# Patient Record
Sex: Female | Born: 1960 | Race: White | Hispanic: No | Marital: Married | Smoking: Never smoker
Health system: Southern US, Community
[De-identification: ages and names within clinical notes are randomized; demographics above are authoritative.]

## PROBLEM LIST (undated history)

## (undated) DIAGNOSIS — R7989 Other specified abnormal findings of blood chemistry: Secondary | ICD-10-CM

## (undated) DIAGNOSIS — R945 Abnormal results of liver function studies: Secondary | ICD-10-CM

## (undated) DIAGNOSIS — E785 Hyperlipidemia, unspecified: Secondary | ICD-10-CM

## (undated) DIAGNOSIS — K219 Gastro-esophageal reflux disease without esophagitis: Secondary | ICD-10-CM

## (undated) HISTORY — PX: TUBAL LIGATION: SHX77

## (undated) HISTORY — PX: OTHER SURGICAL HISTORY: SHX169

## (undated) HISTORY — DX: Other specified abnormal findings of blood chemistry: R79.89

## (undated) HISTORY — PX: COLONOSCOPY: SHX174

## (undated) HISTORY — DX: Abnormal results of liver function studies: R94.5

## (undated) HISTORY — DX: Hyperlipidemia, unspecified: E78.5

## (undated) MED ORDER — BUPIVACAINE-EPINEPHRINE 0.25 %-1:200,000 IJ SOLN
0.25 %-1:200,000 | INTRAMUSCULAR | Status: AC
Start: ? — End: 2013-09-08

## (undated) MED ORDER — LIDOCAINE-EPINEPHRINE 1 %-1:100,000 IJ SOLN
1 %-:00,000 | Freq: Once | INTRAMUSCULAR | Status: AC
Start: ? — End: 2013-09-08

---

## 2011-02-23 NOTE — Telephone Encounter (Signed)
Called pt to remind her of appointment on 02/27/2011. Left message via voicemail indicating date and time of appointment. Instructed pt to call office if she has any concerns about her appointment time.

## 2011-02-27 MED ORDER — LIDOCAINE-EPINEPHRINE 1 %-1:100,000 IJ SOLN
1 %-:00,000 | Freq: Once | INTRAMUSCULAR | Status: AC
Start: 2011-02-27 — End: 2011-02-27

## 2011-02-27 NOTE — Progress Notes (Signed)
Patient present today for full skin exam.

## 2011-02-27 NOTE — Progress Notes (Signed)
Chief complaint: Check spots on the skin    History of present illness: [name] is a 50 year old lady here today for an initial visit.  She is a history of Neomia Dear that use and recreational some exposure.  She is a candidate present.  She is aware of a several raised on bumps on her skin.  One is located on her left cheek has been sensitive to touch.  She notices brown spots on her arms and legs.  She is feeling well today.  She does not have a personal history of skin cancer.  Her allergies medications medical and social history are reviewed by me today.    Exam: The Kimbrough is a well-appearing 50 year old lady in no distress.  She is tan throughout all examined areas.  There is no preauricular, submandibular, or cervical lymphadenopathy.  A full skin examination was performed including her scalp, face, ears, neck, chest, back, abdomen, upper and lower extremities, breasts, buttocks; genital skin was not examined.  There are  lentigos widespread.  She is scattered normal-appearing nevi on her face neck trunk and extremities.  Her right mid back shows a 12 by 8mm pink shiny pearly plaque.  Her left medial cheek shows a 4 x 3 mm thin keratotic papule.    Assessment/plan:  1.  Suspected basal cell carcinoma the right mid back.  I reviewed the diagnosis and relationship to tanning bed use and sun exposure.  Curettage was advised to address this lesion.  The procedure was reviewed and verbal and written consent were obtained.  The risks of pain, bleeding, infection, recurrence and scar were discussed.  I performed the procedure.  The site was cleansed and anesthetized with 1% lidocaine with epinephrine 1:100,000.  A shave biopsy was performed to sample the lesion followed by curettage of the base and 2-3 mm margin.  Drysol was used for hemostasis.  The wound was bandaged and care reviewed.  The specimen was sent to pathology.  I will contact the patient with the results.   2.  Actinic keratosis.  The diagnosis of this precancerous lesion related to sun exposure was reviewed.  I treated 1 lesion with cryotherapy and post-cryotherapy care was reviewed.   3.  Normal nevi, lentigos.  These diagnoses were reviewed.  4.  Personal history of skin cancer.  I discussed sun protection, sunscreen use, the warning signs of skin cancer, the need for self-skin examinations, and the need for regular physician exams every 6 months.  The patient should follow up sooner as needed if new, changing, or symptomatic skin lesions arise.

## 2011-02-27 NOTE — Patient Instructions (Signed)
WOUND CARE INSTRUCTIONS    1. Keep the dressing clean and dry and do not remove for 24/48 hours.    2. Then change the dressing once a day as follows:  a. Wash hands before and after each dressing change.  b. Remove dressing and wash site gently with mild soap and water, rinse, and pat dry.  c. Apply an ointment (Bacitracin, Polysporin, Neosporin, Petroleum jelly or Aquaphor).  d. Apply a non-stick (Telfa) dressing or Band-Aid to cover the wound.     For one week    3. Watch for:  BLEEDING: A small amount of drainage may occur. If bleeding occurs, elevate and rest the surgery site. Apply gauze and steady pressure for 15 minutes. If bleeding continues, call this.    INFECTION: Signs of infection include increased redness, pain, warmth, drainage of pus, and fever. If this occurs, call this office.    4. Special Instructions (follow any that are checked):  ?? []  You have stitches that need to be removed in 0 days  ?? []  Avoid bending at the waist and heavy lifting for two days.  ?? []  Sleep with your head elevated for the next two nights.  ?? []  Rest the surgery site and keep it elevated as much as possible for two days.  ?? []  You may apply an ice-pack for 10-15 minutes every waking hour for the rest of the day.  ?? []  Eat a soft diet and avoid hot food and hot drinks for the rest of the day.  ?? []  Other instructions:      Follow up in 6 months    If you have any questions or concerns, please call our office Monday through Friday at [phone].

## 2011-03-02 NOTE — Progress Notes (Signed)
Quick Note:    I called pt's cell on 7/17 and left message with result, BCC was treated with curettage, no further therapy needed at present, next skin exam advised in 6 months  ______

## 2012-01-02 NOTE — Telephone Encounter (Signed)
Pt called stating that needs her yearly skin exam she is due in July there are no skin exam opened until next year.

## 2012-04-05 NOTE — Progress Notes (Signed)
Chief complaint: History of basal cell carcinoma, nevus on the back, recently diagnosed basal cell carcinoma on the right shoulder    History of present illness: [name] is a 51 year old lady here for a followup visit. She was seen initially in July 2012 diagnosed and treated with curettage and superficial basal cell carcinoma on her right mid back. That site is well-healed without symptoms. In the interim she has seen a dermatologist in [place], and had a second superficial basal cell carcinoma diagnosed with biopsy on her right anterior shoulder. This has yet to be treated, and she requests evaluation and treatment today. She reports being dermatologist noted a mole with our color on her back, this is asymptomatic end of uncertain duration. She requests evaluation. She is feeling well and in her usual state of health today. She has no pain, no current illnesses, no other skin concerns. Her allergies medications medical and social history are reviewed by me today.    Exam: She is awake alert oriented 51 year old lady who appears well and in no distress. There is no preauricular, submandibular, or cervical lymphadenopathy. A full skin examination was performed including her scalp, face, ears, neck, chest, back, abdomen, upper and lower extremities, breasts, buttocks; genital skin was not examined. She is tanned in sun exposed areas, numerous lentigines are on all exposed areas. She has scattered cherry angiomas on her trunk, multiple normal-appearing nevi on her trunk and extremities. Her nevi are predominantly junctional, medium to dark brown in color. There is a dermatofibroma on her left anterior lower leg. Her left mid back shows a 5 x 3 mm junctional nevus with uniform color but irregular shape. Dermoscopy does not identify atypical features. Her right anterior shoulder has 8 by 7 mm pink plaque with scar she confirms this is the location of biopsy-proven superficial basal cell carcinoma. She has a  well-healed, minimal perceptible scar right mid back were superficial basal cell carcinoma was treated one year ago. There is no evidence of recurrence.    Assessment/plan:  1. History of basal cell carcinoma the right mid back. The site is well-healed with a subtle scar, no evidence of recurrence. I discussed sun protection, sunscreen use, the warning signs of skin cancer, the need for self-skin examinations, and the need for regular physician exams every 9-12 months.  The patient should follow up sooner as needed if new, changing, or symptomatic skin lesions arise.   2. Multiple benign nevi, lentigines, cherry angiomas, dermatofibroma. These benign diagnoses were discussed, the treatment is needed. Observation with regular skin exams.  3. Nevus, left mid back. This is darkly pigmented with irregular shape. A shave removal was advised to address this lesion.  The procedure was reviewed and verbal and written consent were obtained.  The risks of pain, bleeding, infection, recurrence and scar were discussed.  I performed the procedure.  The site was cleansed and anesthetized with 1% lidocaine with epinephrine 1:100,000.  A shave removal was performed to remove the lesion in its clinical entirety.  Drysol was used for hemostasis.  The wound was bandaged and care reviewed.  The specimen was sent to pathology.  I will contact the patient with the results and any further treatment that may be necessary.  4. Superficial basal cell carcinoma, right anterior shoulder.  A shave removal followed by curettage was advised to address this lesion with malignant destruction.  The procedure was reviewed and verbal and written consent were obtained.  The risks of pain, bleeding, infection, and scar and  recurrence were discussed.  I performed the procedure.  The site was cleansed and anesthetized with 1% lidocaine with epinephrine 1:100,000.  A shave removal was performed to remove the lesion in its clinical entirety followed by  curettage of the base and 2 mm margin, resulting in a post curettage defect size of 12 x 11 mm.  Drysol was used for hemostasis.  The wound was bandaged and care reviewed.  The specimen was sent to pathology.  I will contact the patient with the results and any further treatment that may be necessary.         [facility]  OFFICE PROCEDURE PROGRESS NOTE        Chart reviewed for the following:   I, Madilyn Fireman. Delorse Lek, MD, have reviewed the History, Physical and updated the Allergic reactions for Chloe Moses     TIME OUT performed immediately prior to start of procedure:   I, Madilyn Fireman. Delorse Lek, MD, have performed the following reviews on CHANTALLE DEFILIPPO prior to the start of the procedure:            * Patient was identified by name and date of birth   * Agreement on procedure being performed was verified  * Risks and Benefits explained to the patient  * Procedure site verified and marked as necessary  * Patient was positioned for comfort  * Consent was signed and verified     Time:       Date of procedure: 04/05/2012    Procedure performed by:  Madilyn Fireman. Delorse Lek, MD    Provider assisted by:  LPN    Patient assisted by: self    How tolerated by patient: tolerated the procedure well with no complications    Comments: none

## 2012-04-10 NOTE — Progress Notes (Signed)
Quick Note:    I called her cell # on 8/28 with report, normal nevus, no further treatment needed. I left a message.  ______

## 2012-09-09 NOTE — Progress Notes (Signed)
Pt present for skin exam for a scaly lesion on chest. First noticed one year ago.

## 2012-09-09 NOTE — Progress Notes (Signed)
Name: Chloe Moses       Age: 52 y.o.       Date: 09/09/2012    Chief Complaint: had a chief complaint of Skin Exam.    Subjective:    HPI:  Ms.. Chloe Moses is a 51 y.o. female who presents for the evaluation of a lesion on the central chest. The lesion appeared 1 years ago. The patient has not had any prior treatment.  Associated symptoms include scaling and persistent lesion.  She states the lesion has never bled. She has a personal history of Basal Cell Carcinoma on the right anterior shoulder for which she underwent curettage in August 2013.  She states this area has been trouble free. She has stopped using the tanning bed and is now getting spray tans.    ROS: Consitutional: Negative, Neuro: Negative for tingling/ numbness  Dermatological : positive for - skin lesion changes    History     Social History   ??? Marital Status: MARRIED     Spouse Name: N/A     Number of Children: N/A   ??? Years of Education: N/A     Occupational History   ??? Not on file.     Social History Main Topics   ??? Smoking status: Never Smoker    ??? Smokeless tobacco: Never Used   ??? Alcohol Use: Yes      Comment: socially   ??? Drug Use:    ??? Sexually Active:      Other Topics Concern   ??? Not on file     Social History Narrative   ??? No narrative on file       No family history on file.    Past Medical History   Diagnosis Date   ??? Tanning bed exposure        Past Surgical History   Procedure Laterality Date   ??? Hx gyn  1989     D+C       Current Outpatient Prescriptions   Medication Sig Dispense Refill   ??? ascorbic acid (VITAMIN C) 250 mg tablet Take  by mouth.           No current facility-administered medications for this visit.       Allergies   Allergen Reactions   ??? Pcn (Penicillins) Hives         Objective:    BP 104/68   Pulse 54   Temp(Src) 97.6 ??F (36.4 ??C) (Oral)   Resp 18   SpO2 99%    Chloe Moses is a 52 y.o. female who appears well and in no distress.  She is awake, alert, and oriented.  A limited skin  evaluation was completed including her chest, upper back and right anterior shoulder areas. There is a well healed round scar on the right anterior shoulder without evidence of lesion recurrence.  There is a raised scaly papule on the mid chest consistent with an inflamed SK.  She has lentigines on her chest and upper back as well.  There are scattered cherry angiomas on her trunk.  There are no lesions suspicious for skin cancer on the viewed areas of today's exam.        Assessment/Plan:  1. Inflamed seborrheic keratoses.  The diagnosis and treatment with liquid nitrogen cryotherapy were reviewed.  The risk or persistence or recurrence of the keratosis and the potential for pigment change at the treated site were reviewed.  I treated 1 lesions with cryotherapy and  care was reviewed.  2. Cherry angiomas.  The diagnosis was reviewed and the patient was reassured that no treatment is needed for these benign lesions.  3. Solar lentigos.  The diagnosis and relationship to sun exposure was reviewed.  Sun protection advised.  4. Personal history of skin cancer.  I discussed sun protection, sunscreen use, the warning signs of skin cancer, the need for self-skin examinations, and the need for regular practitioner exams every 1 year- she has this scheduled for August 2014.  The patient should follow up sooner as needed if new, changing, or symptomatic skin lesions arise.

## 2012-09-09 NOTE — Progress Notes (Signed)
Chloe Moses is a 52 year old lady with a history of basal cell carcinomas on her upper trunk who presents for evaluation of a newly noted rough dry lesion on her mid chest. This is been present for several weeks, has not been previously treated. Surgery sites are well-healed without symptoms. Most recently I treated a basal cell carcinoma on her right anterior shoulder with curettage in August 2013. She is feeling well and in her usual state of health today. She has no pain, no current illnesses, no other skin concerns. Her allergies medications medical and social history are reviewed by me today. I have reviewed the history, physical exam, assessment and plan by Sheila Oats NP and agree. She is awake alert 52 year old lady who appears well and in no distress. There is no preauricular, submandibular, or cervical lymphadenopathy. Her face ears neck chest back arms and hands were examined. She has a well-healed scar her right anterior shoulder were basal cell carcinomas treated, no evidence of recurrence. She has scattered and tension of and seborrheic keratoses. She has an inflamed seborrheic keratosis on her mid chest, the lesion she had noted. There are cherry angioma from her trunk. Each diagnosis was discussed, the inflamed keratosis was treated with cryotherapy, and her skin examination is scheduled in August 2014. I discussed sun protection and avoidance of tanning beds, sunscreen use, the warning signs of skin cancer, the need for self-skin examinations, and the need for regular physician exams every year.  The patient should follow up sooner as needed if new, changing, or symptomatic skin lesions arise.

## 2012-09-23 NOTE — Progress Notes (Signed)
Chloe Moses presents for followup. She is concerned about a light colored scar on her chest where cryotherapy was performed to treat and inflamed keratosis 2 weeks ago. She is feeling well and in her usual state of health today. She has no pain, no current illnesses, no other skin concerns. Her allergies medications medical and social history are reviewed by me today. I have reviewed the history, physical exam, assessment and plan by Sheila Oats NP and agree. On examination she has a pink and hypopigmented flat scar on her mid chest, this is fully reepithelialized. She was reassured, it is anticipated that erythema will fade and pigmentation will gradually darken. She may use cover up if desired. Followup will be as needed.

## 2012-09-23 NOTE — Progress Notes (Signed)
Name: Chloe Moses       Age: 52 y.o.       Date: 09/23/2012    Chief Complaint: had a chief complaint of Follow-up.    Subjective:    HPI  Ms.Chloe Moses is a 52 y.o. female who presents for a follow up from cryotherapy on her chest.  The patient has not had problems such as pain since the last visit.  The patient does have any current concerns related to the color of the area that was frozen and how it is not covered by her spray tan or makeup.    ROS: Constitutional: Negative  Neuro: Negative for tingling/ numbness.  Dermatological : negative for new skin lesions or lesion changes      History     Social History   ??? Marital Status: MARRIED     Spouse Name: N/A     Number of Children: N/A   ??? Years of Education: N/A     Occupational History   ??? Not on file.     Social History Main Topics   ??? Smoking status: Never Smoker    ??? Smokeless tobacco: Never Used   ??? Alcohol Use: Yes      Comment: socially   ??? Drug Use:    ??? Sexually Active:      Other Topics Concern   ??? Not on file     Social History Narrative   ??? No narrative on file       No family history on file.    Past Medical History   Diagnosis Date   ??? Tanning bed exposure        Past Surgical History   Procedure Laterality Date   ??? Hx gyn  1989     D+C       Current Outpatient Prescriptions   Medication Sig Dispense Refill   ??? ascorbic acid (VITAMIN C) 250 mg tablet Take  by mouth.             Allergies   Allergen Reactions   ??? Pcn (Penicillins) Hives         Objective:    BP 124/78   Pulse 70   Temp(Src) 98.1 ??F (36.7 ??C)   Resp 16   Ht 5\' 4"  (1.626 m)   Wt 57.607 kg (127 lb)   BMI 21.79 kg/m2   SpO2 98%    Chloe Moses is a 52 y.o. female who appears well and in no distress.  She is awake, alert, and oriented.  A limited examination of her chest was completed.  There is a 10 mm diameter area of hypopigmentation and erythema consistent with resolving inflammation s/p cryotherapy.      Assessment/Plan:  1. Follow up from cryotherapy  on the chest from an inflamed SK.  The patient states the lesion just pealed off a few days ago and she was concerned about the lack of uptake from her spray tan or coverage from makeup.  Dr. Delorse Lek and I reassured her that this area is healing, in a state of inflammation and needs more time. Suggestions for the use of cortisone ointment were also made in order to decrease inflammation.

## 2012-09-23 NOTE — Patient Instructions (Signed)
Self Skin Exam and Sunscreens    Early detection and treatment is essential in the treatment of all forms of skin cancer.  If caught early, all forms of skin cancer are curable.  In addition to your regular visits, you should perform a monthly skin examination.  Over time, you become familiar with what is normally found on your skin and can identify new or suspicious spots.    One of the screening tools you can use to assess your skin is to follow the ABCDEs:    A= Asymmetry (One half is unlike the other half)     B= Border (An irregular, scalloped or poorly defined edge)    C= Color (Is varied from one area to another, has shades of tan, brown/ black,       white, red or blue)    D= Diameter (Spots larger than 6mm or a pencil eraser)    E= Evolving (New spots or one that is changing in size, shape, or color)    A follow- up interval will be customized based on your history of skin cancer or level of skin damage and risk factors.  In any case, if you notice a suspicious or new spot, an appointment should be arranged between regular visits.    Everyone should use sunscreen and sun-safe practices, which is especially important for those with a personal or family history of skin cancer.  Suggestions for this include:    1. Use daily moisturizers containing SPF 30 or higher.  2. Wear long sleeve clothing with ???UPF??? ratings and a broad-brimmed hat.  3. Apply sunscreen with SPF 30 or higher to all sun exposed areas if you are going to be in the sun.  A broad spectrum UVA/ UVB sunscreen is best.  Don???t forget to REAPPLY every two hours or more often if swimming or sweating!  4. Avoid outside activities during peak sun hours, especially in the summer (10am- 2pm).  5. DO NOT use tanning beds.    Using sunscreen and sun-safe practices can help reduce the likelihood of developing skin cancer or additional skin cancers in those previously diagnosed.

## 2012-10-02 NOTE — Addendum Note (Signed)
Addended by: Sheila Oats B on: 10/02/2012 12:35 PM     Modules accepted: Level of Service

## 2013-09-08 NOTE — Progress Notes (Signed)
Pre-op: Patient present today for the evaluation of basal cell carcinoma to the right forehead above eyebrow . Procedure explained with full understanding.   Filed Vitals:    09/08/13 1530   BP: 110/70   Pulse: 63   Temp: 97.8 ??F (36.6 ??C)   TempSrc: Oral   Resp: 16   Height: 5\' 4"  (1.626 m)   Weight: 57.607 kg (127 lb)   SpO2: 96%     preoperatively, will continue to monitor.         Post-op: Written and verbal post-op wound care instructions given to patient with full understanding of care. Surgical wound bandaged with Skin glue, 2x2 gauze, and coverall tape. All questions and concerns addressed. Vitals stable postoperatively.

## 2013-09-08 NOTE — Progress Notes (Signed)
Name: Chloe Moses       Age: 53 y.o.       Date: 09/08/2013    Chief Complaint: had a chief complaint of Skin Exam.    Subjective:    HPI  Chloe Moses is a 53 y.o. female who presents for a full skin exam.  The patient's last skin exam was about 1 year ago and the patient does have current complaints related to her skin.  She is aware of scaly lesions on her chest and left neck. She states the lesion on the neck has been present for a few weeks - initially starting as an open sore and progressed to a raised scaly lesion.  Ms.. Chloe Moses is feeling well and in her usual state of health today.    She has a history of BCC treated on the right shoulder and back. The scar like area on her chest post cryotherapy has improved since the last visit she states.    ROS: Constitutional: Negative.    Dermatological : positive for - skin lesion changes      History     Social History   ??? Marital Status: MARRIED     Spouse Name: N/A     Number of Children: N/A   ??? Years of Education: N/A     Occupational History   ??? Not on file.     Social History Main Topics   ??? Smoking status: Never Smoker    ??? Smokeless tobacco: Never Used   ??? Alcohol Use: Yes      Comment: socially   ??? Drug Use: Not on file   ??? Sexually Active: Not on file     Other Topics Concern   ??? Not on file     Social History Narrative   ??? No narrative on file       History reviewed. No pertinent family history.    Past Medical History   Diagnosis Date   ??? Tanning bed exposure    ??? Skin cancer      BCC       Past Surgical History   Procedure Laterality Date   ??? Hx gyn  1989     D+C       Current Outpatient Prescriptions   Medication Sig Dispense Refill   ??? ergocalciferol (VITAMIN D2) 50,000 unit capsule Take 50,000 Units by mouth.       ??? ascorbic acid (VITAMIN C) 250 mg tablet Take  by mouth.             Allergies   Allergen Reactions   ??? Compazine [Prochlorperazine Edisylate] Other (comments)     Unable to speak.   ??? Pcn  [Penicillins] Hives         Objective:    BP 110/70   Pulse 63   Temp(Src) 97.8 ??F (36.6 ??C) (Oral)   Resp 16   Ht 5\' 4"  (1.626 m)   Wt 57.607 kg (127 lb)   BMI 21.79 kg/m2   SpO2 96%    Chloe Moses is a 53 y.o. female who appears well and in no distress.  She is awake, alert, and oriented.  There is no preauricular, submandibular, or cervical lymphadenopathy.  A full skin examination was performed including her scalp, face, ears, neck, chest, back, abdomen, upper and lower extremities, breasts, buttocks; genital skin was not examined.  She has fair skin with lentigines on sun exposed areas, moderate sun damage. There is  a 2 x3 mm pink papule on the right forehead -just above the eyebrow that has telangiectasias and is most consistent with BCC. She has skin colored compound nevi on her face, medium brown junctional nevi on her trunk and extremities and a few pink compound nevi - all without concerning features.  There is a pink papule on the left anterior lower leg consistent with a dermatofibroma. There is a 3 x 3 mm elevated papule with crusted surface on the left base of the neck laterally -differentials include SK vs VV vs SCC. There are scattered SKs and cherry angiomas.        Assessment/Plan:  1. Neoplasm of Uncertain Behavior, right forehead.  The differential diagnoses were discussed. A shave biopsy was advised to sample this lesion.  The procedure was reviewed and verbal and written consent were obtained.  The risks of pain, bleeding, infection, and scar were discussed.  The patient is aware that this is a sample and is intended for diagnosis and not therapy of the skin lesion.  I performed the anesthesia and Dr. Nelva Bush performed the procedure.  The site was cleansed and anesthetized with 1% Lidocaine with Epinephrine 1:100,000.  A shave biopsy was performed to sample the lesion.  Drysol was used for hemostasis.  The wound was bandaged and care reviewed.  The specimen was sent for in house  pathology for possible Mohs.  I will contact the patient with the results and any further treatment that may be necessary.  2. Neoplasm of Uncertain Behavior, left neck, r/o scc.  The differential diagnoses were discussed. A shave biopsy was advised to sample this lesion.  The procedure was reviewed and verbal and written consent were obtained.  The risks of pain, bleeding, infection, and scar were discussed.  The patient is aware that this is a sample and is intended for diagnosis and not therapy of the skin lesion.  I performed the anesthesia and Dr. Nelva Bush performed the procedure.  The site was cleansed and anesthetized with 1% Lidocaine with Epinephrine 1:100,000.  A shave biopsy was performed to sample the lesion.  Drysol was used for hemostasis.  The wound was bandaged and care reviewed.  The specimen was sent to pathology.  I will contact the patient with the results and any further treatment that may be necessary.  3. Solar lentigos.  The diagnosis and relationship to sun exposure was reviewed.  Sun protection advised.  4. Personal history of skin cancer, no evidence of recurrence at prior sites.  I discussed sun protection, sunscreen use, the warning signs of skin cancer, the need for self-skin examinations, and the need for regular practitioner exams every 6 months.  The patient should follow up sooner as needed if new, changing, or symptomatic skin lesions arise.  5. Seborrheic keratoses.  The diagnosis was reviewed and the patient was reassured that no treatment is needed for these benign lesions.  6. Cherry angiomas.  The diagnosis was reviewed and the patient was reassured that no treatment is needed for these benign lesions.  7. Normal nevi.  The diagnosis of normal nevi was reviewed.  I discussed sun protection, sunscreen use, the warning signs of skin cancer, the need for self-skin examinations, and the need for regular practitioner exams every 6 months.  The patient should follow up sooner as  needed if new, changing, or symptomatic skin lesions arise.    She will start retinol on her chest to help blend this area.  [facility]   OFFICE PROCEDURE PROGRESS NOTE   Chart reviewed for the following:   I, Nathaniel Yaden B. Henry RusselWoodley, DNP, have reviewed the History, Physical and updated the Allergic reactions for Elmarie MainlandBernadette S Kinyon.    TIME OUT performed immediately prior to start of procedure:   I, Akera Snowberger B. Henry RusselWoodley, DNP, have performed the following reviews on Elmarie MainlandBernadette S [place]   prior to the start of the procedure:     * Patient was identified by name and date of birth   * Agreement on procedure being performed was verified   * Risks and Benefits explained to the patient   * Procedure site verified and marked as necessary   * Patient was positioned for comfort   * Consent was signed and verified     Time: 1500  Date of procedure: 09/08/2013  Procedure performed by: Jerene PitchShelley B. Henry RusselWoodley, DNP and Dr. Delorse LekPadgett  Provider assisted by: lpn   Patient assisted by: self   How tolerated by patient: tolerated the procedure well with no complications   Comments: none

## 2013-09-08 NOTE — Progress Notes (Signed)
Patient present for skin exam.  Has a small place on the left of neck.

## 2013-09-08 NOTE — Patient Instructions (Signed)
Self Skin Exam and Sunscreens    Early detection and treatment is essential in the treatment of all forms of skin cancer.  If caught early, all forms of skin cancer are curable.  In addition to your regular visits, you should perform a monthly skin examination.  Over time, you become familiar with what is normally found on your skin and can identify new or suspicious spots.    One of the screening tools you can use to assess your skin is to follow the ABCDEs:    A= Asymmetry (One half is unlike the other half)     B= Border (An irregular, scalloped or poorly defined edge)    C= Color (Is varied from one area to another, has shades of tan, brown/ black,       white, red or blue)    D= Diameter (Spots larger than 6mm or a pencil eraser)    E= Evolving (New spots or one that is changing in size, shape, or color)    A follow- up interval will be customized based on your history of skin cancer or level of skin damage and risk factors.  In any case, if you notice a suspicious or new spot, an appointment should be arranged between regular visits.    Everyone should use sunscreen and sun-safe practices, which is especially important for those with a personal or family history of skin cancer.  Suggestions for this include:    1. Use daily moisturizers containing SPF 30 or higher.  2. Wear long sleeve clothing with ???UPF??? ratings and a broad-brimmed hat.  3. Apply sunscreen with SPF 30 or higher to all sun exposed areas if you are going to be in the sun.  A broad spectrum UVA/ UVB sunscreen is best.  Don???t forget to REAPPLY every two hours or more often if swimming or sweating!  4. Avoid outside activities during peak sun hours, especially in the summer (10am- 2pm).  5. DO NOT use tanning beds.    Using sunscreen and sun-safe practices can help reduce the likelihood of developing skin cancer or additional skin cancers in those previously diagnosed.

## 2013-09-08 NOTE — Patient Instructions (Addendum)
WOUND CARE INSTRUCTIONS    1. Keep the dressing clean and dry and do not remove for 48 hours.    2. Then change the dressing once a day as follows:  a. Wash hands before and after each dressing change.  b. Remove dressing and wash site gently with mild soap and water, rinse, and pat dry.  c. Apply an ointment (Bacitracin, Polysporin, Neosporin, Petroleum jelly or Aquaphor).  d. Apply a non-stick (Telfa) dressing or Band-Aid to cover the wound.    Remove pressure bandage, Wednesday. You may shower daily at this point, no bandage necessary. Glue will eventually come off within the next 2 weeks. If you still feel rough glue at this point, you may apply vaseline to site daily until fully removed.      3. Watch for:  BLEEDING: A small amount of drainage may occur. If bleeding occurs, elevate and rest the surgery site. Apply gauze and steady pressure for 15 minutes. If bleeding continues, call this office.    INFECTION: Signs of infection include increased redness, pain, warmth, drainage of pus, and fever. If this occurs, call this office.    4. Special Instructions (follow any that are checked):  ?? [] You have stitches that need to be removed in 0 days  ?? [x] Avoid bending at the waist and heavy lifting for two days.  ?? [x] Sleep with your head elevated for the next two nights.  ?? [x] Rest the surgery site and keep it elevated as much as possible for two days.  ?? [x] You may apply an ice-pack for 10-15 minutes every waking hour for the rest of the day.  ?? [] Eat a soft diet and avoid hot food and hot drinks for the rest of the day.  ?? [] Other instructions:     Follow up as needed.  Take Tylenol for pain as needed.      Once the site is healed with no remaining bandages or open areas, protect your surgical site and scar from the sun, as this area will be more sensitive.  Use a broad spectrum sunscreen SPF 30 or higher daily, and a chemical free product (one containing zinc oxide or titanium dioxide) is a good choice if  the area is sensitive.    You may begin to gently massage the surgical site in 2-3 weeks, rubbing in a circular motion along the scar. This can help reduce swelling and thickness of a scar. A scar cream may be used beginnning 1 month after the surgery.     If you have any questions or concerns, please call our office Monday through Friday at [phone].

## 2013-09-08 NOTE — Progress Notes (Signed)
Chief complaint: History of basal cell carcinomas, rough lesion on the neck, pink scaly area on the chest    History of present illness: [name] is a 53 year old lady here for her annual visit, last in February 2014. She has a history of basal cell carcinomas on her trunk which have been treated surgically. The sites are well healed and are without symptoms. For her routine exam today she is aware of a raised sensitive rough lesion at the base of the left side of her neck, and a pink scaly persistent lesion on her chest. She is feeling well and in her usual state of health today. She has no pain, no current illnesses, no other skin concerns. Her allergies medications medical and social history are reviewed by me today.    Exam: She is awake alert well-appearing 53 year old lady with moderate actinic damage, numerous lentigines in all exposed areas. There is no preauricular, submandibular, or cervical lymphadenopathy. A full skin examination was performed including her scalp, face, ears, neck, chest, back, abdomen, upper and lower extremities, breasts, buttocks; genital skin was not examined. She has numerous normal-appearing nevi, intradermal on her face, junction on her trunk and extremities, many are darkly pigmented and similar in appearance to one another, none with atypical features to warrant concern for skin cancer. Her left neck shows a 3 x 3 mm firm raised keratotic papule. She has a benign lichenoid keratosis which is slightly inflamed on her left chest. She has healed scars on her upper trunk where basal cell carcinomas have been treated, no evidence of recurrence. Her right forehead has a shiny indistinct 2 x 2 mm firm papule.    Assessment/plan:  1. History basal cell carcinomas. Treated sites are well-healed, no evidence of recurrence. I discussed sun protection, sunscreen use, the warning signs of skin cancer, the need for self-skin examinations, and the need for regular physician exams every  year due to history of skin cancer.  The patient should follow up sooner as needed if new, changing, or symptomatic skin lesions arise.   2. Lentigines, multiple benign nevi. These diagnoses were discussed. No treatment necessary.  3. 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 Diagnosis reviewed, she was reassured, no treatment is necessary. She will consider using topical retinol on her chest for management of actinic damage.  4. Neoplasm, left base of neck. Inflamed keratosis is favored. Shave removal was performed today for therapy and confirmation of diagnosis. Followup will be based on pathology report.  5. Neoplasm, right forehead. New basal cell carcinoma is favored. A shave biopsy was advised to sample this lesion.  The procedure was reviewed and verbal and written consent were obtained.  The risks of pain, bleeding, infection, and scar were discussed.  The patient is aware that this is a sample and is intended for diagnosis and not therapy of the skin lesion.  I performed the procedure.  The site was cleansed and anesthetized with 1% lidocaine with epinephrine 1:100,000.  A shave biopsy was performed to sample the lesion.  Drysol was used for hemostasis.  The wound was bandaged and the specimen was processed for frozen sections, identifying a basal cell carcinoma with nodular and micronodular features.            Dermatology Pathology Report             [place]                    [address]  [address], [place] [address]      Name: Chloe Moses    Date of Birth: 1961/07/11    Specimen #: [id number]    Date: 09/08/2013    Submitting physician: Billie Ruddy, MD    Source of tissue: right forehead    Clinical Diagnosis: r/o basal cell carcinoma    Gross description: Received fresh is a 1 x 1 x 1 mm shave biopsy of skin. The specimen is bisected and processed for frozen vertical sections.    Microscopic description: This hematoxylin and eosin  stained specimen displayed basaloid nodules with small islands in the dermis    Pathology diagnosis: basal cell carcinoma, nodular/micronodular    Billie Ruddy, MD       Basal cell carcinoma, right forehead above eyebrow. We discussed the diagnosis. Mohs   surgery is indicated by site and poor definition. The procedure was discussed, verbal and written consent were obtained. I performed the procedure. 2 stages were required to reach a tumor free plane. The surgical defect was managed with an intermediate repair.  There were no complications. She will followup as needed as the site heals.      Indications, risks, and options were discussed with Venda Rodes preoperatively. Risks   including, but not limited to: pain, bleeding, infection, tumor recurrence, scarring and   damage to motor and/or sensory nerves, were discussed. patient name chose Mohs   surgery. DANIELLE MINK was an acceptable surgery candidate.    VARSHINI ARRANTS was placed in the appropriate position on the operating table in the Mohs   surgery procedure room. The area was prepped and draped in the standard manner.   Gentian violet was used to outline the clinical margins of the tumor. Local anesthesia   was then obtained. The grossly visible tumor was then removed, an underlying   layer was excised and mapped according to the Mohs technique, and the individual   specimens examined microscopically. The process was repeated until microscopic   examination of the tissue specimens confirmed a tumor-free plane. Hemostasis was   obtained with electrosurgery and pressure. The wound was covered between stages   with moist saline gauze. Wound care instructions (written and/or verbal) and a follow   up appointment were given to Venda Rodes before discharge. DANIJA GOSA was discharged in good condition.    The wound management options of second intent healing, layered closure, local  flap, and/or full thickness skin  graft were discussed. Venda Rodes understands the   aims, risks, alternatives, and possible complications and elects to proceed with an   intermediate layered closure. Wound margins were made vertical, edges undermined   in the subcutaneous plane, standing cones corrected at both poles followed by layered closure. The wound was closed with  buried 5-0 monocryl suture to reduce tension on the skin edges, and skin edges were   approximated with 5-0 monocryl in the dermis to reduce the tension in the epidermis. The final closure length was 8 mm. The wound   was bandaged with skin glue, gauze and Coverroll. Wound care   instructions (written and/or verbal) and a follow up appointment were given to Venda Rodes before discharge. KATINA REMICK was discharged in good condition.       This document was written by Louie Bun, scribe, as dictated by Billie Ruddy, M.D.  [facility]   OFFICE PROCEDURE PROGRESS NOTE     Chart reviewed for the following:     I, Madilyn FiremanJulia K. Delorse LekPadgett, MD, have reviewed the History, Physical and updated the Allergic reactions for Elmarie MainlandBernadette S Slemmer    TIME OUT performed immediately prior to start of procedure:     I, Madilyn FiremanJulia K. Delorse LekPadgett, MD, have performed the following reviews on Elmarie MainlandBernadette S [place] prior to the start of the procedure:     * Patient was identified by name and date of birth   * Agreement on procedure being performed was verified   * Risks and Benefits explained to the patient   * Procedure site verified and marked as necessary   * Patient was positioned for comfort   * Consent was signed and verified     Time: 3:22 PM  Date of procedure: 09/08/2013  Procedure performed by: Madilyn FiremanJulia K. Delorse LekPadgett, MD   Provider assisted by: LPN   Patient assisted by: self   How tolerated by patient: tolerated the procedure well with no complications   Comments: none

## 2013-09-10 NOTE — Progress Notes (Signed)
Quick Note:    I spoke with the patient. She states she is doing fine from her Mohs surgery - had a headache the first night but not after that. She understands the biopsy results show AK. She will monitor for recurrence and we will examine at the next visit.  ______

## 2014-02-24 NOTE — Patient Instructions (Signed)
Self Skin Exam and Sunscreens    Early detection and treatment is essential in the treatment of all forms of skin cancer.  If caught early, all forms of skin cancer are curable.  In addition to your regular visits, you should perform a monthly skin examination.  Over time, you become familiar with what is normally found on your skin and can identify new or suspicious spots.    One of the screening tools you can use to assess your skin is to follow the ABCDEs:    A= Asymmetry (One half is unlike the other half)     B= Border (An irregular, scalloped or poorly defined edge)    C= Color (Is varied from one area to another, has shades of tan, brown/ black,       white, red or blue)    D= Diameter (Spots larger than 6mm or a pencil eraser)    E= Evolving (New spots or one that is changing in size, shape, or color)    A follow- up interval will be customized based on your history of skin cancer or level of skin damage and risk factors.  In any case, if you notice a suspicious or new spot, an appointment should be arranged between regular visits.    Everyone should use sunscreen and sun-safe practices, which is especially important for those with a personal or family history of skin cancer.  Suggestions for this include:    1. Use daily moisturizers containing SPF 30 or higher.  2. Wear long sleeve clothing with ???UPF??? ratings and a broad-brimmed hat.  3. Apply sunscreen with SPF 30 or higher to all sun exposed areas if you are going to be in the sun.  A broad spectrum UVA/ UVB sunscreen is best.  Don???t forget to REAPPLY every two hours or more often if swimming or sweating!  4. Avoid outside activities during peak sun hours, especially in the summer (10am- 2pm).  5. DO NOT use tanning beds.    Using sunscreen and sun-safe practices can help reduce the likelihood of developing skin cancer or additional skin cancers in those previously diagnosed.

## 2014-02-24 NOTE — Progress Notes (Signed)
Patient present for skin exam.

## 2014-02-24 NOTE — Progress Notes (Signed)
[name] is a 53 year old lady with a history of skin cancer including basal cell carcinomas on her right forehead right shoulder and back and treated surgically, as well as actinic keratoses, here for her routine followup. She notices several small right patches across her chest. Surgical sites are well healed and are not producing symptoms. She is feeling well and in her usual state of health today. She has no pain, no current illnesses, no other skin concerns. Her allergies medications medical and social history are reviewed by me today. I have reviewed the history, physical exam, assessment and plan by Arizona Constable NP and agree.    She is awake alert well appearing lady with fair skin, lentigines throughout exposed areas, who appears well and in no distress. There is no preauricular, submandibular, or cervical lymphadenopathy. A full skin examination was performed including her scalp, face, ears, neck, chest, back, abdomen, upper and lower extremities, breasts, buttocks; genital skin was not examined. She has actinic keratosis on her left hairline, several small predominantly palpable actinic keratoses across her chest. Surgical sites on her forehead right shoulder and back are well-healed without evidence of recurrent skin cancer.    Each diagnosis was discussed with her. There is evidence of new or recurrent skin cancer on today's examination. I discussed sun protection, sunscreen use, the warning signs of skin cancer, the need for self-skin examinations, and the need for regular physician exams every 6-12 months.  The patient should follow up sooner as needed if new, changing, or symptomatic skin lesions arise. Actinic keratoses were treated with cryotherapy.

## 2014-02-24 NOTE — Progress Notes (Signed)
Name: Chloe Moses       Age: 53 y.o.       Date: 02/24/2014    Chief Complaint: had concerns including Skin Exam.    Subjective:    HPI  Ms.Chloe Moses is a 53 y.o. female who presents for a full skin exam.  The patient's last skin exam was in January 2015 and the patient does have current complaints related to her skin. She is aware of a few scaly lesions on her chest. There is also a pink, mildly tender bump on the back of the left calf. Ms.. Chloe Moses is feeling well and in her usual state of health today.    The patient's pertinent skin history includes : BCC right forehead, righ shoulder, back, AK    ROS: Constitutional: Negative.    Dermatological : positive for - skin lesion changes      History     Social History   ??? Marital Status: MARRIED     Spouse Name: N/A     Number of Children: N/A   ??? Years of Education: N/A     Occupational History   ??? Not on file.     Social History Main Topics   ??? Smoking status: Never Smoker    ??? Smokeless tobacco: Never Used   ??? Alcohol Use: Yes      Comment: socially   ??? Drug Use: Not on file   ??? Sexual Activity: Not on file     Other Topics Concern   ??? Not on file     Social History Narrative       History reviewed. No pertinent family history.    Past Medical History   Diagnosis Date   ??? Tanning bed exposure    ??? Skin cancer      BCC       Past Surgical History   Procedure Laterality Date   ??? Hx gyn  1989     D+C       Current Outpatient Prescriptions   Medication Sig Dispense Refill   ??? ergocalciferol (VITAMIN D2) 50,000 unit capsule Take 50,000 Units by mouth.     ??? ascorbic acid (VITAMIN C) 250 mg tablet Take  by mouth.           Allergies   Allergen Reactions   ??? Compazine [Prochlorperazine Edisylate] Other (comments)     Unable to speak.   ??? Pcn [Penicillins] Hives         Objective:    BP 98/76 mmHg   Pulse 62   Temp(Src) 98.1 ??F (36.7 ??C) (Oral)   Resp 16   Ht 5\' 4"  (1.626 m)   Wt 57.607 kg (127 lb)   BMI 21.79 kg/m2   SpO2 98%     Chloe Moses is a 53 y.o. female who appears well and in no distress.  She is awake, alert, and oriented.  There is no preauricular, submandibular, or cervical lymphadenopathy.  A full skin examination was performed including her scalp, face, ears, neck, chest, back, abdomen, upper and lower extremities, breasts, buttocks; genital skin was not examined.  She has lentigines on sun exposed areas. There are scattered cherry angiomas and SKs. She has medium and dark brown junctional nevi without concerning features. There is a scar on the right forehead without evidence of lesion recurrence. There are thin scaled AKs on the left upper forehead and left chest.         Assessment/Plan:  1. Normal nevi.  The diagnosis of normal nevi was reviewed.  I discussed sun protection, sunscreen use, the warning signs of skin cancer, the need for self-skin examinations, and the need for regular practitioner exams every 6 months.  The patient should follow up sooner as needed if new, changing, or symptomatic skin lesions arise.  2. Seborrheic keratoses.  The diagnosis was reviewed and the patient was reassured that no treatment is needed for these benign lesions.  3. Actinic Keratoses.  The diagnosis of this precancerous lesion related to sun exposure was reviewed.  Verbal consent was obtained.  I treated 3 lesions with cryotherapy and post-cryotherapy care was reviewed.  4. Cherry angiomas.  The diagnosis was reviewed and the patient was reassured that no treatment is needed for these benign lesions.  5. Solar lentigos.  The diagnosis and relationship to sun exposure was reviewed.  Sun protection advised.  6. Personal history of skin cancer.  I discussed sun protection, sunscreen use, the warning signs of skin cancer, the need for self-skin examinations, and the need for regular practitioner exams every 6 months.  The patient should follow up sooner as needed if new, changing, or symptomatic skin lesions arise.         [facility]   OFFICE PROCEDURE PROGRESS NOTE   Chart reviewed for the following:   I, [place] Sterling Big, DNP, have reviewed the History, Physical and updated the Allergic reactions for Chloe Moses.    TIME OUT performed immediately prior to start of procedure:   I, Max Romano B. Sterling Big, DNP, have performed the following reviews on Chloe Moses   prior to the start of the procedure:     * Patient was identified by name and date of birth   * Agreement on procedure being performed was verified   * Risks and Benefits explained to the patient   * Procedure site verified and marked as necessary   * Patient was positioned for comfort   * Verbal consent was obtained    Time: 0840  Date of procedure: 02/24/2014  Procedure performed by: Radene Gunning. Sterling Big, DNP  Provider assisted by: lpn   Patient assisted by: self   How tolerated by patient: tolerated the procedure well with no complications   Comments: none    The patient is moving to [place]. She will return for her skin exams.

## 2015-12-30 ENCOUNTER — Encounter: Attending: Nurse Practitioner

## 2015-12-30 ENCOUNTER — Ambulatory Visit: Admit: 2015-12-30 | Discharge: 2015-12-30 | Payer: PRIVATE HEALTH INSURANCE | Attending: Nurse Practitioner

## 2015-12-30 ENCOUNTER — Inpatient Hospital Stay: Admit: 2015-12-30

## 2015-12-30 DIAGNOSIS — L570 Actinic keratosis: Secondary | ICD-10-CM

## 2015-12-30 MED ORDER — FLUOROURACIL 0.5 % TOPICAL CREAM
0.5 % | Freq: Every day | CUTANEOUS | 1 refills | 30.00 days | Status: DC
Start: 2015-12-30 — End: 2016-01-04

## 2015-12-30 NOTE — Progress Notes (Signed)
LM for patient to call

## 2015-12-30 NOTE — Progress Notes (Signed)
I spoke with the patient and she is aware of the diagnoses - the leg lesion is sore - she will try Vinegar soaks - no further tx needed. The lesion on the back is BCC with squamous features and I recommend Mohs due to recurrence. She will call for appointment.

## 2015-12-30 NOTE — Progress Notes (Addendum)
Name: Chloe Moses       Age: 55 y.o.       Date: 12/30/2015    Chief Complaint:   Chief Complaint   Patient presents with   ??? Skin Exam     spot on chest       Subjective:    HPI  [name] is a 55 y.o. female who presents for a full skin exam.  The patient's last skin exam was in July 2015 --she had moved to [place] but is here visiting her sister and the patient does have current complaints related to her skin.  She is aware of scaly lesion on the forehead, left cheek, and left chest.  She has noticed this for many weeks.  She states her sister also noticed a mole on her back that has a dark dot.  Patient states she was unaware of this lesion and it does not cause symptoms.  She is careful to observe her skin regularly but has not seen dermatology since her last exam.  [name] is feeling well and in her usual state of health today.    The patient's pertinent skin history includes : History of basal cell carcinoma of the right forehead, shoulder, back.  Actinic keratoses.    ROS: Constitutional: Negative.    Dermatological : positive for - skin lesion changes      Social History     Social History   ??? Marital status: MARRIED     Spouse name: N/A   ??? Number of children: N/A   ??? Years of education: N/A     Occupational History   ??? Not on file.     Social History Main Topics   ??? Smoking status: Never Smoker   ??? Smokeless tobacco: Never Used   ??? Alcohol use Yes      Comment: socially   ??? Drug use: Not on file   ??? Sexual activity: Not on file     Other Topics Concern   ??? Not on file     Social History Narrative       No family history on file.    Past Medical History:   Diagnosis Date   ??? Skin cancer     BCC   ??? Tanning bed exposure        Past Surgical History:   Procedure Laterality Date   ??? HX GYN  1989    D+C       Current Outpatient Prescriptions   Medication Sig Dispense Refill   ??? cyanocobalamin (VITAMIN B-12) 1,000 mcg tablet Take 1,000 mcg by mouth  daily.     ??? fluoruracil (CARAC) 0.5 % topical cream Apply  to affected area daily. Apply to lesions as directed 30 g 1   ??? ergocalciferol (VITAMIN D2) 50,000 unit capsule Take 50,000 Units by mouth.     ??? ascorbic acid (VITAMIN C) 250 mg tablet Take  by mouth.           Allergies   Allergen Reactions   ??? Compazine [Prochlorperazine Edisylate] Other (comments)     Unable to speak.   ??? Pcn [Penicillins] Hives         Objective:    Visit Vitals   ??? BP 110/70 (BP 1 Location: Left arm, BP Patient Position: Sitting)   ??? Pulse 75   ??? Temp 98.4 ??F (36.9 ??C) (Oral)   ??? Ht 5\' 4"  (1.626 m)   ???  Wt 57.6 kg (127 lb)   ??? SpO2 99%   ??? BMI 21.8 kg/m2       Chloe Moses is a 55 y.o. female who appears well and in no distress.  She is awake, alert, and oriented.  There is no preauricular, submandibular, or cervical lymphadenopathy.  A skin examination was performed including her scalp, face (including eyelids), ears, neck, chest, back, abdomen, upper extremities (including digits/nails), lower extremities, breasts, buttocks and genital skin.  There are pink intradermal nevi and medium brown and dark brown junctional nevi, some with 2 tones.  No nevi with concerning features for severe atypia.  On the left distal anterior shin is a 4 mm erythematous papule.  She has lentigines in sun exposed areas.  There are diffuse thin skilled actinic keratosis on the forehead, left cheek, and chest.  Well-healed scars are noted on the right forehead, shoulder without evidence of lesion recurrence.  The scar on her back however does have pink skin change medially.  This is concerning for recurrence and a white scar.  There are scattered waxy macules and keratotic papules consistent with seborrheic keratoses.  She has red and violaceous macules and papules consistent with cherry angiomas.  There is an angiokeratoma on the right labia.                  Assessment/Plan:   1.Neoplasm of Uncertain Behavior, left anterior shin r/o nevus vs atypical lesion.  The differential diagnoses were discussed. A shave biopsy was advised to sample this lesion.  The procedure was reviewed and verbal and written consent were obtained.  The risks of pain, bleeding, infection, and scar were discussed.  The patient is aware that this is a sample and is intended for diagnosis and not therapy of the skin lesion.  I performed the procedure.  The site was cleansed and anesthetized with 1% Lidocaine with Epinephrine 1:100,000.  A shave biopsy was performed to sample the lesion.  Drysol was used for hemostasis.  The wound was bandaged and care reviewed.  The specimen was sent to pathology.  I will contact the patient with the results and any further treatment that may be necessary.    2. Neoplasm of Uncertain Behavior, right mid upper back, r/o recurrent BCC.  The differential diagnoses were discussed. A shave biopsy was advised to sample this lesion.  The procedure was reviewed and verbal and written consent were obtained.  The risks of pain, bleeding, infection, and scar were discussed.  The patient is aware that this is a sample and is intended for diagnosis and not therapy of the skin lesion.  I performed the procedure.  The site was cleansed and anesthetized with 1% Lidocaine with Epinephrine 1:100,000.  A shave biopsy was performed to sample the lesion.  Drysol was used for hemostasis.  The wound was bandaged and care reviewed.  The specimen was sent to pathology.  I will contact the patient with the results and any further treatment that may be necessary.    3. Actinic keratoses.  The diagnosis was discussed and due to the diffuse nature of the lesions that were found on today's exam, I suggest that she use a  field therapy.  I discussed the option of Carac, using it daily for 7 days and then abstaining for 3 weeks.  She will need to repeat this  cycle for 4 months in a row.  She agrees to this method and side effects were reviewed.    4.Solar  lentigos.  The diagnosis and relationship to sun exposure was reviewed.  Sun protection advised.    5. Seborrheic keratoses.  The diagnosis was reviewed and the patient was reassured that no treatment is needed for these benign lesions.    6. Cherry angiomas.  The diagnosis was reviewed and the patient was reassured that no treatment is needed for these benign lesions.    7. Normal nevi.  The diagnosis of normal nevi was reviewed.  I discussed sun protection, sunscreen use, the warning signs of skin cancer, the need for self-skin examinations, and the need for regular practitioner exams every 6 months.  The patient should follow up sooner as needed if new, changing, or symptomatic skin lesions arise.    8. Personal history of skin cancer.  I discussed sun protection, sunscreen use, the warning signs of skin cancer, the need for self-skin examinations, and the need for regular practitioner exams every 6 months.  The patient should follow up sooner as needed if new, changing, or symptomatic skin lesions arise.      [facility]   OFFICE PROCEDURE PROGRESS NOTE   Chart reviewed for the following:   I, Al[place] Sterling Big, DNP, have reviewed the History, Physical and updated the Allergic reactions for Chloe Moses.    TIME OUT performed immediately prior to start of procedure:   I, Jamonta Goerner B. Sterling Big, DNP, have performed the following reviews on Chloe Moses   prior to the start of the procedure:     * Patient was identified by name and date of birth   * Agreement on procedure being performed was verified   * Risks and Benefits explained to the patient   * Procedure site verified and marked as necessary   * Patient was positioned for comfort   * Consent was signed and verified     Time: [id number]  Date of procedure: 12/30/2015  Procedure performed by: Radene Gunning. Sterling Big, DNP   Provider assisted by: lpn   Patient assisted by: self   How tolerated by patient: tolerated the procedure well with no complications   Comments: none

## 2015-12-30 NOTE — Patient Instructions (Signed)
Self Skin Exam and Sunscreens    Early detection and treatment is essential in the treatment of all forms of skin cancer.  If caught early, all forms of skin cancer are curable.  In addition to your regular visits, you should perform a monthly skin examination.  Over time, you become familiar with what is normally found on your skin and can identify new or suspicious spots.    One of the screening tools you can use to assess your skin is to follow the ABCDEs:    A= Asymmetry (One half is unlike the other half)     B= Border (An irregular, scalloped or poorly defined edge)    C= Color (Is varied from one area to another, has shades of tan, brown/ black,       white, red or blue)    D= Diameter (Spots larger than 6mm or a pencil eraser)    E= Evolving (New spots or one that is changing in size, shape, or color)    A follow- up interval will be customized based on your history of skin cancer or level of skin damage and risk factors.  In any case, if you notice a suspicious or new spot, an appointment should be arranged between regular visits.    Everyone should use sunscreen and sun-safe practices, which is especially important for those with a personal or family history of skin cancer.  Suggestions for this include:    1. Use daily moisturizers containing SPF 30 or higher.  2. Wear long sleeve clothing with ???UPF??? ratings and a broad-brimmed hat.  3. Apply sunscreen with SPF 30 or higher to all sun exposed areas if you are going to be in the sun.  A broad spectrum UVA/ UVB sunscreen is best.  Don???t forget to REAPPLY every two hours or more often if swimming or sweating!  4. Avoid outside activities during peak sun hours, especially in the summer (10am- 2pm).  5. DO NOT use tanning beds.    Using sunscreen and sun-safe practices can help reduce the likelihood of developing skin cancer or additional skin cancers in those previously diagnosed.

## 2015-12-31 ENCOUNTER — Encounter: Attending: Nurse Practitioner

## 2016-01-03 NOTE — Telephone Encounter (Signed)
Pt LVM requesting a call back from [place] stating she has a possible infection on the biopsy area

## 2016-01-04 MED ORDER — FLUOROURACIL 0.5 % TOPICAL CREAM
0.5 % | Freq: Every day | CUTANEOUS | 1 refills | 30.00 days | Status: DC
Start: 2016-01-04 — End: 2017-10-10

## 2016-01-04 NOTE — Telephone Encounter (Signed)
See path

## 2016-05-29 ENCOUNTER — Encounter: Attending: Dermatology

## 2016-08-21 ENCOUNTER — Encounter: Attending: Dermatology

## 2016-10-03 NOTE — Telephone Encounter (Signed)
Mohs Pre-Op Assessment    Patient Appointment Date: 10/23/16 @ 2:00 pm    Chloe Moses, 56 y.o., female      does confirm site: [facility]  does ID site. (Can they still visibly see the site)  does not have Hepatitis C   does not have HIV (If YES, set up consult appointment)    Allergies:  Allergies   Allergen Reactions   ??? Compazine [Prochlorperazine Edisylate] Other (comments)     Unable to speak.   ??? Pcn [Penicillins] Hives       does not have an Architect (Pacemaker, AICD, brain stimulator, etc.)    does not need antibiotics      is not taking NSAIDs        is not taking Garlic  is not taking Ginkgo  is not taking Ginseng  is not taking Fish oils  is not taking Vit E    does not take a blood thinner(i.e. Coumadin/Warfarin, Plavix, Brilinta, Pradaxa, Xarelto, Effient)  If taking Coumadin needs to have PT/INR drawn and faxed results within a week of surgery      Pre operative assessment questions asked to patient.  Patient has a general understanding of the procedure, and has been versed that there will be local anesthesia used in the procedure and that She will be ok to drive themselves to and from the appointment.     Patient has been notified to arrive 15-20 minutes early and they may eat or drink before arriving.

## 2016-10-23 ENCOUNTER — Ambulatory Visit: Admit: 2016-10-23 | Discharge: 2016-10-23 | Payer: PRIVATE HEALTH INSURANCE | Attending: Dermatology

## 2016-10-23 DIAGNOSIS — C4491 Basal cell carcinoma of skin, unspecified: Secondary | ICD-10-CM

## 2016-10-23 NOTE — Progress Notes (Signed)
This note is written by Mick Sell, as dictated by Hart Carwin. Nelva Bush, MD.    CC: Recurrent basal cell carcinoma on the right mid upper back    History of present illness:     Chloe Moses is a 56 y.o. female referred by Arizona Constable, DNP. She has a biopsy-proven ulcerated basal cell carcinoma with metatypical features on the right mid upper back. This is a recurrent basal cell carcinoma present for less than one year described as a persistent lesion with prior treatment of curettage in 2012. Biopsy confirmed the diagnosis of basal cell carcinoma, and I reviewed the written pathology. She is feeling well and in her usual state of health today. She has no pain, no current illnesses, no other skin concerns. Her allergies, medications, medical, and social history are reviewed by me today.    Exam:     She is an awake, alert, and oriented 56 y.o. female who appears well and in no distress. There is no preauricular, submandibular, or cervical lymphadenopathy. I examined her back. She has a 15 x 10 mm pink patch located around a well healed scar on her right mid upper back.  She confirms location.    Assessment/plan:    1. Recurrent basal cell carcinoma, right mid upper back. I discussed the diagnosis of basal cell carcinoma and summarized the pathology report. We reviewed multiple treatment options, including topical therapy, curettage, and Mohs surgery due to recurrent nature. She elected for the simplest procedure that can provide high cure rate.    Curettage was advised to address this lesion with malignant destruction.  The procedure was reviewed and verbal and written consent were obtained.  The risks of pain, bleeding, infection, scar, and recurrence of the lesion were discussed.  I performed the procedure.  The site was cleansed and anesthetized with 1% Lidocaine with Epinephrine 1:100,000.  Curettage was performed by me to include a 2 mm margin, resulting in a post curettage  defect size of 19 x 14 mm.  Drysol was used for hemostasis.  The wound was bandaged and care reviewed.  The specimen was sent to pathology.  I will contact the patient with the results and any further treatment that may be necessary.          2. History of nonmelanoma skin cancer. I discussed the diagnosis and recommend routine examinations with Arizona Constable, DNP for surveillance.    The documentation recorded by the scribe accurately reflects the service I personally performed and the decisions made by me.     [facility]   OFFICE PROCEDURE PROGRESS NOTE     Chart reviewed for the following:     I, Hart Carwin. Nelva Bush, MD, have reviewed the History, Physical and updated the Allergic reactions for Venda Rodes    TIME OUT performed immediately prior to start of procedure:     I, Hart Carwin. Nelva Bush, MD, have performed the following reviews on SELETA HOVLAND prior to the start of the procedure:     * Patient was identified by name and date of birth   * Agreement on procedure being performed was verified   * Risks and Benefits explained to the patient   * Procedure site verified and marked as necessary   * Patient was positioned for comfort   * Consent was signed and verified     Time: 2:04 PM   Date of procedure: 10/23/2016  Procedure performed by: Hart Carwin. Nelva Bush, MD  Provider assisted by: LPN   Patient assisted by: self   How tolerated by patient: tolerated the procedure well with no complications   Comments: none

## 2017-09-27 NOTE — Telephone Encounter (Signed)
I LM for the patient about her request for a refill on a cream prescribed. Last visit for me was May 2017. She did have surgery with Dr. Nelva Bush since but does not have any booked follow up appts at this time. Given the fact that she has had skin cancer on her face and has not had a recent exam - I feel it would be unwise to prescribe Carac that could mask a skin cancer and cause a larger issue in the future. I asked her to call and schedule an appt.

## 2017-09-27 NOTE — Telephone Encounter (Signed)
Pt would like a refill on the topical cream. She would like sent to the pharmacy on file. She can be best reached at [phone]

## 2017-10-10 ENCOUNTER — Ambulatory Visit: Admit: 2017-10-10 | Discharge: 2017-10-10 | Payer: PRIVATE HEALTH INSURANCE | Attending: Nurse Practitioner

## 2017-10-10 DIAGNOSIS — L570 Actinic keratosis: Secondary | ICD-10-CM

## 2017-10-10 MED ORDER — FLUOROURACIL 5 % TOPICAL CREAM
5 % | CUTANEOUS | 0 refills | 30.00 days | Status: AC
Start: 2017-10-10 — End: ?

## 2017-10-10 NOTE — Patient Instructions (Signed)
Chief Complaint   Patient presents with   ??? Skin Exam     Areas of concern: Face     Self Skin Exam and Sunscreens    Early detection and treatment is essential in the treatment of all forms of skin cancer.  If caught early, all forms of skin cancer are curable.  In addition to your regular visits, you should perform a monthly skin examination.  Over time, you become familiar with what is normally found on your skin and can identify new or suspicious spots.    One of the screening tools you can use to assess your skin is to follow the ABCDEs:    A= Asymmetry (One half is unlike the other half)     B= Border (An irregular, scalloped or poorly defined edge)    C= Color (Is varied from one area to another, has shades of tan, brown/ black, white, red or blue)    D= Diameter (Spots larger than 39mm or a pencil eraser)    E= Evolving (New spots or one that is changing in size, shape, or color)    A follow- up interval will be customized based on your history of skin cancer or level of skin damage and risk factors.  In any case, if you notice a suspicious or new spot, an appointment should be arranged between regular visits.    Everyone should use sunscreen and sun-safe practices, which is especially important for those with a personal or family history of skin cancer.  Suggestions for this include:    1. Use daily moisturizers containing SPF 30 or higher.  2. Wear long sleeve clothing with ???UPF??? ratings and a broad-brimmed hat.  3. Apply sunscreen with SPF 30 or higher to all sun exposed areas if you are going to be in the sun.  A broad spectrum UVA/ UVB sunscreen is best.  Don???t forget to REAPPLY every two hours or more often if swimming or sweating!  4. Avoid outside activities during peak sun hours, especially in the summer (10am- 2pm).  5. DO NOT use tanning beds.    Using sunscreen and sun-safe practices can help reduce the likelihood of developing skin cancer or additional skin cancers in those previously diagnosed.

## 2017-10-10 NOTE — Progress Notes (Signed)
Written by Leeann Must, ScribeKick, as dictated by Radene Gunning Sterling Big, [place].    Name: Chloe Moses       Age: 57 y.o.       Date: 10/10/2017    Chief Complaint:   Chief Complaint   Patient presents with   ??? Skin Exam     Areas of concern: Face       Subjective:    HPI  Chloe Moses is a 57 y.o. female who presents for a full skin exam.  The patient's last skin exam was on 12/30/15 and the patient does have current complaints related to her skin. She reports scaly lesions on her face. She states she never picked up the Carac creamed that was prescribed in 2017. She is feeling well and in her usual state of health today. She has no current illnesses, no other skin concerns. Her allergies, medications, medical, and social history are reviewed by me today.     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    Dermatological : positive for - skin lesion changes    Social History     Socioeconomic History   ??? Marital status: MARRIED     Spouse name: Not on file   ??? Number of children: Not on file   ??? Years of education: Not on file   ??? Highest education level: Not on file   Social Needs   ??? Financial resource strain: Not on file   ??? Food insecurity - worry: Not on file   ??? Food insecurity - inability: Not on file   ??? Transportation needs - medical: Not on file   ??? Transportation needs - non-medical: Not on file   Occupational History   ??? Not on file   Tobacco Use   ??? Smoking status: Never Smoker   ??? Smokeless tobacco: Never Used   Substance and Sexual Activity   ??? Alcohol use: Yes     Comment: socially   ??? Drug use: Not on file   ??? Sexual activity: Not on file   Other Topics Concern   ??? Not on file   Social History Narrative   ??? Not on file        History reviewed. No pertinent family history.    Past Medical History:   Diagnosis Date   ??? Skin cancer     BCC   ??? Tanning bed exposure        Past Surgical History:   Procedure Laterality Date   ??? HX GYN  1989    D+C       Current Outpatient Medications   Medication Sig Dispense Refill   ??? cyanocobalamin (VITAMIN B-12) 1,000 mcg tablet Take 1,000 mcg by mouth daily.     ??? ergocalciferol (VITAMIN D2) 50,000 unit capsule Take 50,000 Units by mouth.     ??? ascorbic acid (VITAMIN C) 250 mg tablet Take  by mouth.       ??? clindamycin (CLEOCIN) 150 mg capsule Take 150 mg by mouth four (4) times daily.  0   ??? fluoruracil (CARAC) 0.5 % topical cream Apply  to affected area daily. Apply to lesions as directed 30 g 1  Allergies   Allergen Reactions   ??? Compazine [Prochlorperazine Edisylate] Other (comments)     Unable to speak.   ??? Pcn [Penicillins] Hives         Objective:    Visit Vitals  BP 115/68   Pulse 60   Temp 97.8 ??F (36.6 ??C)   Resp 16   Ht 5\' 6"  (1.676 m)   Wt 127 lb (57.6 kg)   SpO2 97%   BMI 20.50 kg/m??       Chloe Moses is a 57 y.o. female who appears well and in no distress.  She is awake, alert, and oriented.  There is no preauricular, submandibular, or cervical lymphadenopathy.  A skin examination was performed including her scalp, face (including eyelids), ears, neck, chest, back, and upper extremities (including digits/nails).    There are well-healed scars on the right mid upper back, right forehead, and right anterior shoulder without evidence of lesion recurrence. There are diffuse actinic keratoses on the face and chest. There is a thick-scaled actinic keratosis on the back. There are lentigines on sun exposed areas.??She has pink intradermal nevi and brown junctional nevi, no concerning features for severe atypia. She has scattered waxy macules and keratotic papules consistent with seborrheic keratoses. She has scattered  red papules consistent with cherry angiomas. No evidence of new skin cancers in areas evaluated.     Assessment/Plan:    1. Personal history of nonmelanoma skin cancer.  I discussed sun protection, sunscreen use, the warning signs of skin cancer, the need for self-skin examinations, and the need for regular practitioner exams every 1 year.  The patient should follow up sooner as needed if new, changing, or symptomatic skin lesions arise.    2. Actinic Keratoses.  The diagnosis of this precancerous lesion related to sun exposure was reviewed.  Verbal consent was obtained.  I treated 1 lesion with cryotherapy and post-cryotherapy care was reviewed. I prescribed Efudex to treat the diffuse actinic keratoses on the face and chest. She will apply the cream BID for one week followed by a 2-3 week break. She will do this four times for 4 full weeks of treatment. Use and side effects were reviewed.     3. Solar lentigos.  The diagnosis and relationship to sun exposure was reviewed.  Sun protection advised.    4. Normal nevi.  The diagnosis of normal nevi was reviewed.  I discussed sun protection, sunscreen use, the warning signs of skin cancer, the need for self-skin examinations, and the need for regular practitioner exams every 1 year.  The patient should follow up sooner as needed if new, changing, or symptomatic skin lesions arise.    5. Seborrheic keratoses.  The diagnosis was reviewed and the patient was reassured that no treatment is needed for these benign lesions.    6. Cherry angiomas.  The diagnosis was reviewed and the patient was reassured that no treatment is needed for these benign lesions.    This plan was reviewed with the patient and patient agrees. All questions were answered.    This scribe documentation was reviewed by me and accurately reflects the examination and decisions made by me.

## 2018-10-29 ENCOUNTER — Other Ambulatory Visit: Payer: Self-pay

## 2018-10-29 ENCOUNTER — Encounter: Payer: Self-pay | Admitting: Family Medicine

## 2018-10-29 ENCOUNTER — Ambulatory Visit: Payer: BLUE CROSS/BLUE SHIELD | Admitting: Family Medicine

## 2018-10-29 ENCOUNTER — Ambulatory Visit: Payer: Self-pay

## 2018-10-29 ENCOUNTER — Encounter (INDEPENDENT_AMBULATORY_CARE_PROVIDER_SITE_OTHER): Payer: Self-pay

## 2018-10-29 VITALS — BP 108/70 | HR 65 | Ht 66.0 in | Wt 137.0 lb

## 2018-10-29 DIAGNOSIS — M1712 Unilateral primary osteoarthritis, left knee: Secondary | ICD-10-CM | POA: Diagnosis not present

## 2018-10-29 DIAGNOSIS — G8929 Other chronic pain: Secondary | ICD-10-CM

## 2018-10-29 DIAGNOSIS — M25562 Pain in left knee: Secondary | ICD-10-CM | POA: Diagnosis not present

## 2018-10-29 HISTORY — DX: Unilateral primary osteoarthritis, left knee: M17.12

## 2018-10-29 NOTE — Patient Instructions (Signed)
Good to see you.  Ice 20 minutes 2 times daily. Usually after activity and before bed. pennsaid pinkie amount topically 2 times daily as needed.  Exercises 3 times a week.  Spenco orthotics "total support" online would be great  Biking would be good.  Over the counter get  Turmeric 500mg  daily  Tart cherry extract any dose at night Vitamin D 2000 IU daily  See me again in 8 weeks

## 2018-10-29 NOTE — Progress Notes (Signed)
[facility] [address] [place], [place] [address] Phone: [phone] Subjective:   Janet Goodwin, am serving as a scribe for Dr. Hulan Saas.    CC: Left knee pain  QIO:[id number]  Janet Goodwin is a 58 y.o. female coming in with complaint of left knee pain. Patient states that 20 years ago she fell skiing. She feels like she tore her meniscus at that time but did not seek medical attention. In September 2019 she was in a tennis clinic and felt sharp pain 45 minutes in. Had to discontinue playing. Was doing better but 3 weeks ago drove up to see her daughter in [place] and when getting out of the car she felt like she reinjured it. Patient is unsure as to where her pain is. She thinks its over the patella. Weather increases pain and pain is worse in the mornings. Has been icing and using Advil.      History reviewed. Goodwin pertinent past medical history. History reviewed. Goodwin pertinent surgical history. Social History   Socioeconomic History  . Marital status: Married    Spouse name: Not on file  . Number of children: Not on file  . Years of education: Not on file  . Highest education level: Not on file  Occupational History  . Not on file  Social Needs  . Financial resource strain: Not on file  . Food insecurity:    Worry: Not on file    Inability: Not on file  . Transportation needs:    Medical: Not on file    Non-medical: Not on file  Tobacco Use  . Smoking status: Not on file  Substance and Sexual Activity  . Alcohol use: Not on file  . Drug use: Not on file  . Sexual activity: Not on file  Lifestyle  . Physical activity:    Days per week: Not on file    Minutes per session: Not on file  . Stress: Not on file  Relationships  . Social connections:    Talks on phone: Not on file    Gets together: Not on file    Attends religious service: Not on file    Active member of club or organization: Not on file    Attends meetings of  clubs or organizations: Not on file    Relationship status: Not on file  Other Topics Concern  . Not on file  Social History Narrative  . Not on file   Allergies  Allergen Reactions  . Compazine [Prochlorperazine Edisylate]   . Penicillins    History reviewed. Goodwin pertinent family history.      Current Outpatient Medications (Hematological):  .  vitamin B-12 (CYANOCOBALAMIN) 100 MCG tablet, Take 100 mcg by mouth daily.  Current Outpatient Medications (Other):  Marland Kitchen  Ascorbic Acid (VITAMIN C) 100 MG tablet, Take 100 mg by mouth daily. .  cholecalciferol (VITAMIN D3) 25 MCG (1000 UT) tablet, Take 1,000 Units by mouth daily. Marland Kitchen  pyridOXINE (VITAMIN B-6) 100 MG tablet, Take 100 mg by mouth daily.    Past medical history, social, surgical and family history all reviewed in electronic medical record.  Goodwin pertanent information unless stated regarding to the chief complaint.   Review of Systems:  Goodwin headache, visual changes, nausea, vomiting, diarrhea, constipation, dizziness, abdominal pain, skin rash, fevers, chills, night sweats, weight loss, swollen lymph nodes, body aches, joint swelling,  chest pain, shortness of breath, mood changes.  Positive muscle aches  Objective  Blood  pressure 108/70, pulse 65, height 5\' 6"  (1.676 m), weight 137 lb (62.1 kg), SpO2 97 %.    General: Goodwin apparent distress alert and oriented x3 mood and affect normal, dressed appropriately.  HEENT: Pupils equal, extraocular movements intact  Respiratory: Patient's speak in full sentences and does not appear short of breath  Cardiovascular: Goodwin lower extremity edema, non tender, Goodwin erythema  Skin: Warm dry intact with Goodwin signs of infection or rash on extremities or on axial skeleton.  Abdomen: Soft nontender  Neuro: Cranial nerves II through XII are intact, neurovascularly intact in all extremities with 2+ DTRs and 2+ pulses.  Lymph: Goodwin lymphadenopathy of posterior or anterior cervical chain or axillae  bilaterally.  Gait normal with good balance and coordination.  MSK:  tender with full range of motion and good stability and symmetric strength and tone of shoulders, elbows, wrist, hip, and ankles bilaterally.  Left knee exam shows the patient does have some narrowing over the medial joint line.  Tenderness level as well.  Goodwin significant instability though noted.  Abnormal thigh to calf ratio noted.  Mild crepitus with range of motion.  Neurovascular intact distally with 5 out of 5 strength  Limited musculoskeletal ultrasound of patient's left knee shows the patient does have some narrowing over the medial joint line with a degenerative meniscal tear with mild displacement.  Patient does not have any Baker cyst noted.  Mild arthritic changes of the patellofemoral joint.  Normal lateral joint line space.     Impression and Recommendations:     This case required medical decision making of moderate complexity. The above documentation has been reviewed and is accurate and complete Lyndal Pulley, DO       Note: This dictation was prepared with Dragon dictation along with smaller phrase technology. Any transcriptional errors that result from this process are unintentional.

## 2018-10-29 NOTE — Assessment & Plan Note (Signed)
Degenerative, medial compartment, discussed HEP discussed the possibility of custom bracing but will hold at this time.  Patient will start with over-the-counter bracing, topical anti-inflammatories, icing regimen, discussed which activities of doing which also avoid.  Patient is to increase activity slowly over the course the next several days.  Follow-up again in 4 to 8 weeks.

## 2019-06-11 ENCOUNTER — Telehealth: Payer: Self-pay | Admitting: General Practice

## 2019-06-11 NOTE — Telephone Encounter (Signed)
FYI for documentation. Patient is not our patient currently and no future appointments made

## 2019-06-11 NOTE — Telephone Encounter (Signed)
Pt called to schedule new patient appointment with Dr Einar Pheasant ASAP.  She stated she has been having chest pain and sob for a year, it has gotten work in the last 3 months she stated she gets SOB just talking on phone.  I spoke with Anasstasiya she recommend pt to go to urgent care or ER for chest pains and SOB.  I spoke with pt to let her know Anastasiya comments.  Offered to schedule her new patient appointment with Dr Einar Pheasant pt declined.

## 2019-06-12 ENCOUNTER — Telehealth: Payer: Self-pay | Admitting: Internal Medicine

## 2019-06-12 NOTE — Telephone Encounter (Signed)
I guess ok to be seen, if ok with NP if already assigned or established

## 2019-06-12 NOTE — Telephone Encounter (Signed)
I am confused as I dont see any mention of heart disease anywhere on the Epic chart......maybe she sees a cardiologist or has a diagnosis that would not be on the Epic chart yet?

## 2019-06-12 NOTE — Telephone Encounter (Signed)
Left pt vm to call back to schedule new pt appt with Jenny Reichmann

## 2019-06-12 NOTE — Telephone Encounter (Signed)
She has not been seen for this issue yet.

## 2019-06-12 NOTE — Telephone Encounter (Signed)
Patient is requesting an MD.  She has been feeling like she has had cardiology issues for a year so would prefer an MD.  Does not feel like she is in distress of any sort.  Patient works on [employer].  Would like to know if Jenny Reichmann would take on as a patient?

## 2019-06-19 ENCOUNTER — Ambulatory Visit: Payer: BLUE CROSS/BLUE SHIELD | Admitting: Internal Medicine

## 2019-06-19 ENCOUNTER — Other Ambulatory Visit: Payer: Self-pay

## 2019-06-19 ENCOUNTER — Encounter: Payer: Self-pay | Admitting: Internal Medicine

## 2019-06-19 ENCOUNTER — Other Ambulatory Visit: Payer: BC Managed Care – PPO

## 2019-06-19 ENCOUNTER — Ambulatory Visit (INDEPENDENT_AMBULATORY_CARE_PROVIDER_SITE_OTHER)
Admission: RE | Admit: 2019-06-19 | Discharge: 2019-06-19 | Disposition: A | Discharge status: Home / Self Care | Payer: BC Managed Care – PPO | Source: Ambulatory Visit | Attending: Internal Medicine | Admitting: Internal Medicine

## 2019-06-19 ENCOUNTER — Ambulatory Visit (INDEPENDENT_AMBULATORY_CARE_PROVIDER_SITE_OTHER): Payer: BC Managed Care – PPO | Admitting: Internal Medicine

## 2019-06-19 VITALS — BP 116/78 | HR 88 | Temp 98.4°F | Ht 66.0 in | Wt 134.0 lb

## 2019-06-19 DIAGNOSIS — J309 Allergic rhinitis, unspecified: Secondary | ICD-10-CM

## 2019-06-19 DIAGNOSIS — R945 Abnormal results of liver function studies: Secondary | ICD-10-CM

## 2019-06-19 DIAGNOSIS — Z1159 Encounter for screening for other viral diseases: Secondary | ICD-10-CM

## 2019-06-19 DIAGNOSIS — Z114 Encounter for screening for human immunodeficiency virus [HIV]: Secondary | ICD-10-CM

## 2019-06-19 DIAGNOSIS — Z0001 Encounter for general adult medical examination with abnormal findings: Secondary | ICD-10-CM | POA: Diagnosis not present

## 2019-06-19 DIAGNOSIS — R0600 Dyspnea, unspecified: Secondary | ICD-10-CM

## 2019-06-19 DIAGNOSIS — E785 Hyperlipidemia, unspecified: Secondary | ICD-10-CM | POA: Diagnosis not present

## 2019-06-19 DIAGNOSIS — R7989 Other specified abnormal findings of blood chemistry: Secondary | ICD-10-CM

## 2019-06-19 DIAGNOSIS — Z860101 Personal history of adenomatous and serrated colon polyps: Secondary | ICD-10-CM

## 2019-06-19 DIAGNOSIS — Z86010 Personal history of colonic polyps: Secondary | ICD-10-CM

## 2019-06-19 DIAGNOSIS — R0609 Other forms of dyspnea: Secondary | ICD-10-CM | POA: Insufficient documentation

## 2019-06-19 HISTORY — DX: Personal history of colonic polyps: Z86.010

## 2019-06-19 HISTORY — DX: Personal history of adenomatous and serrated colon polyps: Z86.0101

## 2019-06-19 MED ORDER — METHYLPREDNISOLONE ACETATE 80 MG/ML IJ SUSP
80.0000 mg | Freq: Once | INTRAMUSCULAR | Status: AC
  Administered 2019-06-19: 80 mg via INTRAMUSCULAR

## 2019-06-19 MED ORDER — PREDNISONE 10 MG PO TABS: ORAL_TABLET | ORAL | 0 refills | Status: DC

## 2019-06-19 MED ORDER — ALBUTEROL SULFATE HFA 108 (90 BASE) MCG/ACT IN AERS: 2.0000 | INHALATION_SPRAY | Freq: Four times a day (QID) | RESPIRATORY_TRACT | 11 refills | Status: DC | PRN

## 2019-06-19 NOTE — Assessment & Plan Note (Signed)
For lab, also for HIV screen as well per protocol

## 2019-06-19 NOTE — Patient Instructions (Addendum)
Your EKG was OK today  You had the steroid shot today  Please take all new medication as prescribed - the short course of prednisone, and the inhaler as needed  Please continue all other medications as before, and refills have been done if requested.  Please have the pharmacy call with any other refills you may need.  Please continue your efforts at being more active, low cholesterol diet, and weight control.  You are otherwise up to date with prevention measures today.  Please keep your appointments with your specialists as you may have planned  You will be contacted regarding the referral for: Echocardiogram, Pulmonary Function Tests (PFTs), and Pulmonary physician  Please go to the XRAY Department in the Basement (go straight as you get off the elevator) for the x-ray testing  Please go to the LAB in the Basement (turn left off the elevator) for the tests to be done today - just the hep c and hiv testing today  You will be contacted by phone if any changes need to be made immediately.  Otherwise, you will receive a letter about your results with an explanation, but please check with MyChart first.  Please remember to sign up for MyChart if you have not done so, as this will be important to you in the future with finding out test results, communicating by private email, and scheduling acute appointments online when needed.  Please return in 6 months, or sooner if needed

## 2019-06-19 NOTE — Progress Notes (Signed)
Subjective:    Patient ID: Janet Goodwin, female    DOB: November 12, 1960, 58 y.o.   MRN: [id number]  HPI   Here for wellness and f/u;  Overall doing ok;  Pt denies neurological change such as new headache, facial or extremity weakness.  Pt denies polydipsia, polyuria, or low sugar symptoms. Pt states overall good compliance with treatment and medications, good tolerability, and has been trying to follow appropriate diet.  Pt denies worsening depressive symptoms, suicidal ideation or panic. No fever, night sweats, wt loss, loss of appetite, or other constitutional symptoms.  Pt states good ability with ADL's, has low fall risk, home safety adequate, no other significant changes in hearing or vision, and occasionally active with exercise. Also Pt denies chest pain, wheezing, orthopnea, PND, increased LE swelling, palpitations, dizziness or syncope. 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 Even walking up the hill in the front parking lot is difficult, worse x 2 yrs, Also states has strong FH of abnormal LFTs with some getting liver bx - no specific diagnosis ever found but later recognizes the term NAFLD. Past Medical History:  Diagnosis Date  . Abnormal LFTs   . HLD (hyperlipidemia)    Past Surgical History:  Procedure Laterality Date  . TUBAL LIGATION Bilateral     reports that she has never smoked. She has never used smokeless tobacco. She reports current alcohol use of about 1.0 standard drinks of alcohol per week. She reports that she does not use drugs. family history includes Aortic stenosis in her father; Hyperlipidemia in her mother; Rheum arthritis in her paternal grandmother. Allergies  Allergen Reactions  . Compazine [Prochlorperazine Edisylate]   . Penicillins    Current Outpatient Medications on File Prior to Visit  Medication Sig Dispense Refill  .  Ascorbic Acid (VITAMIN C) 100 MG tablet Take 100 mg by mouth daily.    . cholecalciferol (VITAMIN D3) 25 MCG (1000 UT) tablet Take 1,000 Units by mouth daily.    Marland Kitchen pyridOXINE (VITAMIN B-6) 100 MG tablet Take 100 mg by mouth daily.    . vitamin B-12 (CYANOCOBALAMIN) 100 MCG tablet Take 100 mcg by mouth daily.     No current facility-administered medications on file prior to visit.    Review of Systems Constitutional: Negative for other unusual diaphoresis, sweats, appetite or weight changes HENT: Negative for other worsening hearing loss, ear pain, facial swelling, mouth sores or neck stiffness.   Eyes: Negative for other worsening pain, redness or other visual disturbance.  Respiratory: Negative for other stridor or swelling Cardiovascular: Negative for other palpitations or other chest pain  Gastrointestinal: Negative for worsening diarrhea or loose stools, blood in stool, distention or other pain Genitourinary: Negative for hematuria, flank pain or other change in urine volume.  Musculoskeletal: Negative for myalgias or other joint swelling.  Skin: Negative for other color change, or other wound or worsening drainage.  Neurological: Negative for other syncope or numbness. Hematological: Negative for other adenopathy or swelling Psychiatric/Behavioral: Negative for hallucinations, other worsening agitation, SI, self-injury, or new decreased concentration All otherwise neg per pt     Objective:   Physical Exam BP 116/78   Pulse 88   Temp 98.4 F (36.9 C) (Oral)   Ht 5\' 6"  (1.676 m)   Wt 134 lb (60.8 kg)   SpO2 99%   BMI 21.63 kg/m  VS noted,  Constitutional:  Pt is oriented to person, place, and time. Appears well-developed and well-nourished, in no significant distress and comfortable Head: Normocephalic and atraumatic  Eyes: Conjunctivae and EOM are normal. Pupils are equal, round, and reactive to light Right Ear: External ear normal without discharge Left Ear: External ear  normal without discharge Nose: Nose without discharge or deformity Mouth/Throat: Oropharynx is without other ulcerations and moist  Neck: Normal range of motion. Neck supple. No JVD present. No tracheal deviation present or significant neck LA or mass Cardiovascular: Normal rate, regular rhythm, normal heart sounds and intact distal pulses.   Pulmonary/Chest: WOB normal and breath sounds without rales but with few bilat wheezing  Abdominal: Soft. Bowel sounds are normal. NT. No HSM  Musculoskeletal: Normal range of motion. Exhibits no edema Lymphadenopathy: Has no other cervical adenopathy.  Neurological: Pt is alert and oriented to person, place, and time. Pt has normal reflexes. No cranial nerve deficit. Motor grossly intact, Gait intact Skin: Skin is warm and dry. No rash noted or new ulcerations Psychiatric:  Has normal mood and affect. Behavior is normal without agitation All otherwise neg per pt  CMET, CBC, TSH, normal except mild elev lfts sept 10, also LDL 158  ECG today I have personally interpreted:  Sinus brady 58     Assessment & Plan:

## 2019-06-19 NOTE — Assessment & Plan Note (Addendum)
Her chief concern today, ecg reviewed, etiology unclear but suspect mild persistent asthma with evidence for mild exacerbation today, for depomedrol IM 80, predpac asd, albuterol HFA prn, but also for cxr, pfts, echo, and pulm referral  In addition to the time spent performing CPE, I spent an additional 45 minutes face to face,in which greater than 50% of this time was spent in counseling and coordination of care for patient's acute illness as documented, including the differential dx, treatment, further evaluation and other management of dyspnea, allergic rhinitis, HLD, abnormal LFts

## 2019-06-19 NOTE — Assessment & Plan Note (Signed)
For otc antihistamine

## 2019-06-19 NOTE — Assessment & Plan Note (Signed)

## 2019-06-19 NOTE — Assessment & Plan Note (Signed)
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

## 2019-06-19 NOTE — Assessment & Plan Note (Signed)
stable overall by history and exam, recent data reviewed with pt, and pt to continue medical treatment as before,  to f/u any worsening symptoms or concerns  

## 2019-06-20 ENCOUNTER — Other Ambulatory Visit: Payer: Self-pay | Admitting: Internal Medicine

## 2019-06-20 DIAGNOSIS — R0602 Shortness of breath: Secondary | ICD-10-CM

## 2019-06-20 LAB — HEPATITIS C ANTIBODY
Hepatitis C Ab: NONREACTIVE
SIGNAL TO CUT-OFF: 0.01 (ref <1.00)

## 2019-06-20 LAB — HIV ANTIBODY (ROUTINE TESTING W REFLEX): HIV 1&2 Ab, 4th Generation: NONREACTIVE

## 2019-06-25 ENCOUNTER — Encounter: Payer: Self-pay | Admitting: Internal Medicine

## 2019-06-25 ENCOUNTER — Ambulatory Visit (HOSPITAL_COMMUNITY): Payer: BC Managed Care – PPO | Attending: Cardiovascular Disease

## 2019-06-25 ENCOUNTER — Ambulatory Visit (INDEPENDENT_AMBULATORY_CARE_PROVIDER_SITE_OTHER)
Admission: RE | Admit: 2019-06-25 | Discharge: 2019-06-25 | Disposition: A | Discharge status: Home / Self Care | Payer: BC Managed Care – PPO | Source: Ambulatory Visit | Attending: Internal Medicine | Admitting: Internal Medicine

## 2019-06-25 ENCOUNTER — Other Ambulatory Visit: Payer: Self-pay

## 2019-06-25 ENCOUNTER — Ambulatory Visit: Payer: BLUE CROSS/BLUE SHIELD | Admitting: Family Medicine

## 2019-06-25 DIAGNOSIS — R0602 Shortness of breath: Secondary | ICD-10-CM

## 2019-06-25 DIAGNOSIS — R0600 Dyspnea, unspecified: Secondary | ICD-10-CM

## 2019-07-01 ENCOUNTER — Institutional Professional Consult (permissible substitution): Payer: BC Managed Care – PPO | Admitting: Pulmonary Disease

## 2019-07-03 ENCOUNTER — Ambulatory Visit: Payer: BC Managed Care – PPO | Admitting: Internal Medicine

## 2019-07-03 ENCOUNTER — Encounter: Payer: Self-pay | Admitting: Internal Medicine

## 2019-07-03 ENCOUNTER — Other Ambulatory Visit: Payer: Self-pay

## 2019-07-03 DIAGNOSIS — R05 Cough: Secondary | ICD-10-CM | POA: Diagnosis not present

## 2019-07-03 DIAGNOSIS — R918 Other nonspecific abnormal finding of lung field: Secondary | ICD-10-CM

## 2019-07-03 DIAGNOSIS — R058 Other specified cough: Secondary | ICD-10-CM

## 2019-07-03 MED ORDER — PANTOPRAZOLE SODIUM 40 MG PO TBEC: DELAYED_RELEASE_TABLET | ORAL | 2 refills | Status: DC

## 2019-07-03 NOTE — Patient Instructions (Addendum)
GERD (REFLUX)  is an extremely common cause of respiratory symptoms just like yours , many times with no obvious heartburn at all.    It can be treated with medication, but also with lifestyle changes including elevation of the head of your bed (ideally with 6 -8inch blocks under the headboard of your bed),  Smoking cessation, avoidance of late meals, excessive alcohol, and avoid fatty foods, chocolate, peppermint, colas, red wine, and acidic juices such as orange juice.  NO MINT OR MENTHOL PRODUCTS SO NO COUGH DROPS  USE SUGARLESS CANDY INSTEAD (Jolley ranchers or Stover's or Life Savers) or even ice chips will also do - the key is to swallow to prevent all throat clearing. NO OIL BASED VITAMINS - use powdered substitutes.  Avoid fish oil when coughing.  Protonix 40 mg Take 30- 60 min before your first and last meals of the day.   If not better after thanksgiving give me a call to see ENT to take a look at your airway.   We will contact for the follow up CT chest (at 6 m)

## 2019-07-03 NOTE — Progress Notes (Signed)
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     History of Present Illness  07/03/2019  Pulmonary/ 1st office eval/Janet Goodwin  Chief Complaint  Patient presents with  . Pulmonary Consult    Referred by Dr. Cathlean Goodwin.  Pt c/o SOB for the past several years, worse for the past 6 months. She gets winded talking on the phone and with exertion such as walking up a flight of stairs. She states had PNA 30 yrs ago and gets bronchitis once per year. She was prescribed an albuterol inhaler but has not picked up yet.   Dyspnea: MMRC2 = can't walk a nl pace on a flat grade s sob but does fine slow and flat and avoids steps  Cough: sensation of pnds / some sensation of gen chest tightness with cough  Sleep: no resp symptoms SABA use: has not picked it up yet / depomedrol 120 mg no better   No obvious day to day or daytime variability or assoc excess/ purulent sputum or mucus plugs or hemoptysis or cp or chest tightness, subjective wheeze or overt sinus or hb symptoms.   Sleeping fine  without nocturnal  or early am exacerbation  of respiratory  c/o's or need for noct saba. Also denies any obvious fluctuation of symptoms with weather or environmental changes or other aggravating or alleviating factors except as outlined above   No unusual exposure hx or h/o childhood pna/ asthma or knowledge of premature birth.  Current Allergies, Complete Past Medical History, Past Surgical  History, Family History, and Social History were reviewed in [employer] record.  ROS  The following are not active complaints unless bolded Hoarseness, sore throat, dysphagia, dental problems, itching, sneezing,  nasal congestion or discharge of excess mucus or purulent secretions, ear ache,   fever, chills, sweats, unintended wt loss or wt gain, classically pleuritic or exertional cp,  orthopnea pnd or arm/hand swelling  or leg swelling, presyncope, palpitations, abdominal pain, anorexia, nausea, vomiting, diarrhea  or change in bowel habits or change in bladder habits, change in stools or change in urine, dysuria, hematuria,  rash, arthralgias, visual complaints, headache, numbness, weakness or ataxia or problems with walking or coordination,  change in mood or  memory.             Past Medical History:  Diagnosis Date  . Abnormal LFTs   . HLD (hyperlipidemia)     Outpatient Medications Prior to Visit  Medication Sig Dispense Refill  . Ascorbic Acid (VITAMIN C) 100 MG tablet Take 100 mg by mouth daily.    . cholecalciferol (VITAMIN D3) 25 MCG (1000 UT) tablet Take 1,000 Units by mouth daily.    Marland Kitchen pyridOXINE (VITAMIN B-6) 100 MG tablet Take 100 mg by mouth daily.    . vitamin B-12 (CYANOCOBALAMIN) 100 MCG tablet Take 100 mcg by mouth daily.    Marland Kitchen  11  .     0      Objective:     BP 108/72 (BP Location: Left Arm, Cuff Size: Normal)   Pulse 80   Temp (!) 97.1 F (36.2 C) (Temporal)   Ht 5\' 6"  (1.676 m)   Wt 132 lb (59.9 kg)   SpO2 100% Comment: on RA  BMI 21.31 kg/m   SpO2: 100 %(on RA)    Pleasant wf nad raspy insp sound with upper airway wheeze on insp transmitted to the lower airways   . HEENT : pt wearing mask not removed for exam due to covid -19 concerns.    NECK :  without JVD/Nodes/TM/ nl carotid upstrokes bilaterally   LUNGS: no acc muscle use,  Nl contour chest which is clear to A and P bilaterally x for transmitted upper airway  sounds without cough on insp or exp maneuvers   CV:  RRR  no s3 or murmur or increase in P2, and no edema   ABD:  soft and nontender with nl inspiratory excursion in the supine position. No bruits or organomegaly appreciated, bowel sounds nl  MS:  Nl gait/ ext warm without deformities, calf tenderness, cyanosis or clubbing No obvious joint restrictions   SKIN: warm and dry without lesions    NEURO:  alert, approp, nl sensorium with  no motor or cerebellar deficits apparent.    I personally reviewed images and agree with radiology impression as follows:   Chest CT w/o contrast 06/25/2019 6 mm nodule is noted in right lower lobe with pleural tail, as well as a 4 mm subpleural nodule seen in left lower lobe. Non-contrast chest CT at 3-6 months is recommended. If the nodules are stable at time of repeat CT, then future CT at 18-24 months (from today's scan) is considered optional for low-risk patients,       Assessment   Upper airway cough syndrome Onset march 2020 assoc with upper airway wheeze on exam 07/03/2019  - rec max rx for GERD 07/03/2019 and f/u ent p  First of Dec 2020 if not improving   Absence of any noct symptoms and classic exam are most c/w Upper airway cough syndrome (previously labeled PNDS),  is so named because it's frequently impossible to sort out how much is  CR/sinusitis with freq throat clearing (which can be related to primary GERD)   vs  causing  secondary (" extra esophageal")  GERD from wide swings in gastric pressure that occur with throat clearing, often  promoting self use of mint and menthol lozenges that reduce the lower esophageal sphincter tone and exacerbate the problem further in a cyclical fashion.   These are the same pts (now being labeled as having "irritable larynx syndrome" by some cough centers) who not infrequently have a history of having failed to tolerate ace inhibitors,  dry powder inhalers or biphosphonates or report having  atypical/extraesophageal reflux symptoms that don't respond to standard doses of PPI  and are easily confused as having aecopd or asthma flares by even experienced allergists/ pulmonologists (myself included).   >>> rec max gerd rx then refer to ENT for inspection of upper airway if not improving p Tgiving holida    Multiple pulmonary nodules determined by computed tomography of lung Never smoker - CT 06/25/19 pos for MPNs largest was 6 mm assoc with multiple classic granulomas (so likely these are not yet calcified)  > rec repeat in 6 m  CT results reviewed with pt >>> Too small  for PET or bx, not suspicious enough for excisional bx > really only option for now is follow the Fleischner society guidelines as rec by radiology = 6 m f/u then further f/u is opitons   Discussed in detail all the  indications, usual  risks and alternatives  relative to the benefits with patient who agrees to proceed with conservative f/u as outlined        Total time devoted to counseling  > 50 % of initial 60 min office visit:  review case with pt/ discussion of options/alternatives/ personally creating written customized instructions  in presence of pt  then going over those specific  Instructions directly with the pt including how to use all of the meds but in particular covering each new medication in detail and the difference between the maintenance= "automatic" meds and the prns using an action plan format for the latter (If this problem/symptom => do that organization reading Left to right).  Please see AVS from this visit for a full list of these instructions which I personally wrote for this pt and  are unique to this visit.    Christinia Gully, MD 07/03/2019

## 2019-07-04 ENCOUNTER — Encounter: Payer: Self-pay | Admitting: Internal Medicine

## 2019-07-04 DIAGNOSIS — R918 Other nonspecific abnormal finding of lung field: Secondary | ICD-10-CM | POA: Insufficient documentation

## 2019-07-04 NOTE — Assessment & Plan Note (Addendum)
Never smoker - CT 06/25/19 pos for MPNs largest was 6 mm assoc with multiple classic granulomas (so likely these are not yet calcified)  > rec repeat in 6 m  CT results reviewed with pt >>> Too small for PET or bx, not suspicious enough for excisional bx > really only option for now is follow the Fleischner society guidelines as rec by radiology = 6 m f/u then further f/u is opitons   Discussed in detail all the  indications, usual  risks and alternatives  relative to the benefits with patient who agrees to proceed with conservative f/u as outlined     Total time devoted to counseling  > 50 % of initial 60 min office visit:  review case with pt/ discussion of options/alternatives/ personally creating written customized instructions  in presence of pt  then going over those specific  Instructions directly with the pt including how to use all of the meds but in particular covering each new medication in detail and the difference between the maintenance= "automatic" meds and the prns using an action plan format for the latter (If this problem/symptom => do that organization reading Left to right).  Please see AVS from this visit for a full list of these instructions which I personally wrote for this pt and  are unique to this visit.

## 2019-07-04 NOTE — Assessment & Plan Note (Signed)
Onset march 2020 assoc with upper airway wheeze on exam 07/03/2019  - rec max rx for GERD 07/03/2019 and f/u ent p  First of Dec 2020 if not improving   Absence of any noct symptoms and classic exam are most c/w Upper airway cough syndrome (previously labeled PNDS),  is so named because it's frequently impossible to sort out how much is  CR/sinusitis with freq throat clearing (which can be related to primary GERD)   vs  causing  secondary (" extra esophageal")  GERD from wide swings in gastric pressure that occur with throat clearing, often  promoting self use of mint and menthol lozenges that reduce the lower esophageal sphincter tone and exacerbate the problem further in a cyclical fashion.   These are the same pts (now being labeled as having "irritable larynx syndrome" by some cough centers) who not infrequently have a history of having failed to tolerate ace inhibitors,  dry powder inhalers or biphosphonates or report having atypical/extraesophageal reflux symptoms that don't respond to standard doses of PPI  and are easily confused as having aecopd or asthma flares by even experienced allergists/ pulmonologists (myself included).    >>> rec max gerd rx then refer to ENT for inspection of upper airway if not improving p Tgiving holida

## 2019-07-20 DIAGNOSIS — R05 Cough: Secondary | ICD-10-CM

## 2019-07-20 DIAGNOSIS — R058 Other specified cough: Secondary | ICD-10-CM

## 2019-07-21 NOTE — Telephone Encounter (Signed)
Yes ok to refer to ent/ [facility]

## 2019-07-21 NOTE — Telephone Encounter (Signed)
Received the following email from patient:  "Dr. Melvyn Novas,  I saw you several weeks ago for shortness of breath and you thought I had LPR.  I have been taking the PPI you ordered but I have not seen much improvement. You mentioned about a referral to an ENT and I think that may be a good idea. My sister-in-law sees Dr. Lindaann Pascal  and is very happy with him. Could I get a referral to see him?         I appreciate your time and all your help.                                            Thank you,                             Janet Goodwin"  She wishes to proceed forward with the ENT referral. She would prefer to see Dr. Erik Obey.   MW, please advise if you are ok with this. Thanks!

## 2019-07-26 DIAGNOSIS — K219 Gastro-esophageal reflux disease without esophagitis: Secondary | ICD-10-CM | POA: Insufficient documentation

## 2019-09-04 ENCOUNTER — Other Ambulatory Visit: Payer: Self-pay

## 2019-09-04 ENCOUNTER — Encounter: Payer: Self-pay | Admitting: Allergy and Immunology

## 2019-09-04 ENCOUNTER — Ambulatory Visit: Payer: BC Managed Care – PPO | Admitting: Allergy and Immunology

## 2019-09-04 VITALS — BP 106/60 | HR 62 | Temp 97.7°F | Resp 16 | Ht 66.0 in | Wt 133.4 lb

## 2019-09-04 DIAGNOSIS — J3089 Other allergic rhinitis: Secondary | ICD-10-CM | POA: Diagnosis not present

## 2019-09-04 DIAGNOSIS — R0609 Other forms of dyspnea: Secondary | ICD-10-CM | POA: Diagnosis not present

## 2019-09-04 HISTORY — DX: Other allergic rhinitis: J30.89

## 2019-09-04 MED ORDER — FLUTICASONE PROPIONATE 50 MCG/ACT NA SUSP: 1.0000 | Freq: Two times a day (BID) | NASAL | 5 refills | Status: DC | PRN

## 2019-09-04 MED ORDER — AZELASTINE HCL 0.1 % NA SOLN: NASAL | 5 refills | Status: DC

## 2019-09-04 NOTE — Assessment & Plan Note (Addendum)
Epicutaneous test were negative today despite a positive histamine control.  Intradermal testing revealed borderline/equivocal reactivity to dust mite antigen.  Intranasal steroids, intranasal antihistamines, and first generation antihistamines are effective for symptoms associated with non-allergic rhinitis, whereas second generation antihistamines such as cetirizine (Zyrtec), loratadine (Claritin) and fexofenadine (Allegra) have been found to be ineffective for this condition.  Aeroallergen avoidance measures have been discussed and provided in written form.  A prescription has been provided for azelastine nasal spray, one spray per nostril 1-2 times daily as needed. Proper nasal spray technique has been discussed and demonstrated.  Fluticasone nasal spray, 1 spray per nostril twice daily as needed.  Nasal saline lavage (NeilMed) has been recommended as needed and prior to medicated nasal sprays along with instructions for proper administration.  For thick post nasal drainage, add guaifenesin 1200 mg (Mucinex Maximum Strength)  twice daily as needed with adequate hydration as discussed.

## 2019-09-04 NOTE — Assessment & Plan Note (Addendum)
Unclear etiology.  Spirometry today was normal without significant postbronchodilator improvement while symptomatic.  Corticosteroid injection has provided no relief.  A proton pump inhibitor therapeutic trial provided no relief.  Pulmonology and otolaryngology evaluation has been negative.  Considerations include vocal cord dysfunction or airway compression.  Treatment plan as outlined above, to what ever extent postnasal drainage is involved.  Consider imaging of the neck.  Consider otolaryngology second opinion by Dr. Benjamine Mola or evaluation at a multispecialty center.

## 2019-09-04 NOTE — Patient Instructions (Addendum)
Perennial allergic rhinitis with a predominantly nonallergic component Epicutaneous test were negative today despite a positive histamine control.  Intradermal testing revealed borderline/equivocal reactivity to dust mite antigen.  Intranasal steroids, intranasal antihistamines, and first generation antihistamines are effective for symptoms associated with non-allergic rhinitis, whereas second generation antihistamines such as cetirizine (Zyrtec), loratadine (Claritin) and fexofenadine (Allegra) have been found to be ineffective for this condition.  Aeroallergen avoidance measures have been discussed and provided in written form.  A prescription has been provided for azelastine nasal spray, one spray per nostril 1-2 times daily as needed. Proper nasal spray technique has been discussed and demonstrated.  Fluticasone nasal spray, 1 spray per nostril twice daily as needed.  Nasal saline lavage (NeilMed) has been recommended as needed and prior to medicated nasal sprays along with instructions for proper administration.  For thick post nasal drainage, add guaifenesin 1200 mg (Mucinex Maximum Strength)  twice daily as needed with adequate hydration as discussed.  Other forms of dyspnea Unclear etiology.  Spirometry today was normal without significant postbronchodilator improvement while symptomatic.  Corticosteroid injection has provided no relief.  A proton pump inhibitor therapeutic trial provided no relief.  Pulmonology and otolaryngology evaluation has been negative.  Considerations include vocal cord dysfunction or airway compression.  Treatment plan as outlined above, to what ever extent postnasal drainage is involved.  Consider imaging of the neck.  Consider otolaryngology second opinion by Dr. Benjamine Mola or evaluation at a multispecialty center.   Follow-up if needed  Control of House Dust Mite Allergen  House dust mites play a major role in allergic asthma and rhinitis.  They occur in  environments with high humidity wherever human skin, the food for dust mites is found. High levels have been detected in dust obtained from mattresses, pillows, carpets, upholstered furniture, bed covers, clothes and soft toys.  The principal allergen of the house dust mite is found in its feces.  A gram of dust may contain 1,000 mites and 250,000 fecal particles.  Mite antigen is easily measured in the air during house cleaning activities.    1. Encase mattresses, including the box spring, and pillow, in an air tight cover.  Seal the zipper end of the encased mattresses with wide adhesive tape. 2. Wash the bedding in water of 130 degrees Farenheit weekly.  Avoid cotton comforters/quilts and flannel bedding: the most ideal bed covering is the dacron comforter. 3. Remove all upholstered furniture from the bedroom. 4. Remove carpets, carpet padding, rugs, and non-washable window drapes from the bedroom.  Wash drapes weekly or use plastic window coverings. 5. Remove all non-washable stuffed toys from the bedroom.  Wash stuffed toys weekly. 6. Have the room cleaned frequently with a vacuum cleaner and a damp dust-mop.  The patient should not be in a room which is being cleaned and should wait 1 hour after cleaning before going into the room. 7. Close and seal all heating outlets in the bedroom.  Otherwise, the room will become filled with dust-laden air.  An electric heater can be used to heat the room. 8. Reduce indoor humidity to less than 50%.  Do not use a humidifier.

## 2019-09-04 NOTE — Progress Notes (Signed)
New Patient Note  RE: Janet Goodwin MRN: [id number] DOB: [birth year] Date of Office Visit: 09/04/2019  Referring provider: Biagio Borg, MD Primary care provider: Biagio Borg, MD  Chief Complaint: Breathing Problem and Sinus Problem  History of present illness: Janet Goodwin is a 59 y.o. female seen today in consultation requested by Cathlean Cower, MD.   She reports that several years ago she began to experience episodes of dyspnea while living in [place].  She moved to [place] couple years ago and the dyspnea progressed.  She reports that currently she experiences shortness of breath and "feels like (she is) in stridor and gasping for air all day, every day."  On occasion she experiences chest tightness.  She reports that the dyspnea seems worse in the morning time, with even mild exertion, and when speaking on the phone.  She received a corticosteroid injection without perceived benefit.  She saw her pulmonologist, Dr. 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  She underwent a proton pump inhibitor therapeutic trial for over a month without perceived benefit. Evaluation by her otolaryngologist, Dr. Erik Obey, was unrevealing.  She denies unexpected weight loss, recurrent fevers, and drenching night sweats.  In addition to the dyspnea, she complains of "constant, constant postnasal drainage", nasal congestion, and occasional ear pressure despite taking loratadine/pseudoephedrine and fluticasone nasal spray. No significant seasonal symptom variation has been noted nor have specific environmental triggers been identified.  Assessment and plan: Perennial allergic rhinitis with a predominantly nonallergic component Epicutaneous test were negative today despite a positive histamine control.  Intradermal testing revealed borderline/equivocal reactivity to dust mite antigen.  Intranasal steroids, intranasal antihistamines, and first generation antihistamines are effective  for symptoms associated with non-allergic rhinitis, whereas second generation antihistamines such as cetirizine (Zyrtec), loratadine (Claritin) and fexofenadine (Allegra) have been found to be ineffective for this condition.  Aeroallergen avoidance measures have been discussed and provided in written form.  A prescription has been provided for azelastine nasal spray, one spray per nostril 1-2 times daily as needed. Proper nasal spray technique has been discussed and demonstrated.  Fluticasone nasal spray, 1 spray per nostril twice daily as needed.  Nasal saline lavage (NeilMed) has been recommended as needed and prior to medicated nasal sprays along with instructions for proper administration.  For thick post nasal drainage, add guaifenesin 1200 mg (Mucinex Maximum Strength)  twice daily as needed with adequate hydration as discussed.  Other forms of dyspnea Unclear etiology.  Spirometry today was normal without significant postbronchodilator improvement while symptomatic.  Corticosteroid injection has provided no relief.  A proton pump inhibitor therapeutic trial provided no relief.  Pulmonology and otolaryngology evaluation has been negative.  Considerations include vocal cord dysfunction or airway compression.  Treatment plan as outlined above, to what ever extent postnasal drainage is involved.  Consider imaging of the neck.  Consider otolaryngology second opinion by Dr. Benjamine Mola or evaluation at a multispecialty center.   Meds ordered this encounter  Medications  . azelastine (ASTELIN) 0.1 % nasal spray    Sig: 1 spray per nostril 1-2 times daily as needed    Dispense:  30 mL    Refill:  5  . fluticasone (FLONASE) 50 MCG/ACT nasal spray    Sig: Place 1 spray into both nostrils 2 (two) times daily as needed for allergies or rhinitis.    Dispense:  18.2 mL    Refill:  5    Diagnostics: Spirometry: FVC was 2.46 L and FEV1 was 2.33 L (84%  predicted) without significant  postbronchodilator improvement.  This study was performed while the patient was experiencing dyspnea.  Please see scanned spirometry results for details. Epicutaneous testing: Negative despite a positive histamine control. Intradermal testing: Borderline positive to dust mite antigen.   Physical examination: Blood pressure 106/60, pulse 62, temperature 97.7 F (36.5 C), temperature source Oral, resp. rate 16, height 5\' 6"  (1.676 m), weight 133 lb 6.1 oz (60.5 kg), SpO2 100 %.  General: Alert, interactive, in no acute distress. HEENT: TMs pearly gray, turbinates moderately edematous without discharge, post-pharynx erythematous. Neck: Supple without lymphadenopathy. Lungs: Upper airway sounds transmitted. CV: Normal S1, S2 without murmurs. Abdomen: Nondistended, nontender. Skin: Warm and dry, without lesions or rashes. Extremities:  No clubbing, cyanosis or edema. Neuro:   Grossly intact.  Review of systems:  Review of systems negative except as noted in HPI / PMHx or noted below: Review of Systems  Constitutional: Negative.   HENT: Negative.   Eyes: Negative.   Respiratory: Negative.   Cardiovascular: Negative.   Gastrointestinal: Negative.   Genitourinary: Negative.   Musculoskeletal: Negative.   Skin: Negative.   Neurological: Negative.   Endo/Heme/Allergies: Negative.   Psychiatric/Behavioral: Negative.     Past medical history:  Past Medical History:  Diagnosis Date  . Abnormal LFTs   . HLD (hyperlipidemia)     Past surgical history:  Past Surgical History:  Procedure Laterality Date  . TUBAL LIGATION Bilateral     Family history: Family History  Problem Relation Age of Onset  . Hyperlipidemia Mother   . Aortic stenosis Father   . Rheum arthritis Paternal Grandmother   . Allergic rhinitis Neg Hx   . Angioedema Neg Hx   . Asthma Neg Hx   . Eczema Neg Hx   . Immunodeficiency Neg Hx   . Urticaria Neg Hx     Social history: Social History    Socioeconomic History  . Marital status: Married    Spouse name: Not on file  . Number of children: Not on file  . Years of education: Not on file  . Highest education level: Not on file  Occupational History  . Not on file  Tobacco Use  . Smoking status: Never Smoker  . Smokeless tobacco: Never Used  Substance and Sexual Activity  . Alcohol use: Yes    Alcohol/week: 1.0 standard drinks    Types: 1 Glasses of wine per week  . Drug use: Never  . Sexual activity: Not on file  Other Topics Concern  . Not on file  Social History Narrative  . Not on file   Social Determinants of Health   Financial Resource Strain:   . Difficulty of Paying Living Expenses: Not on file  Food Insecurity:   . Worried About Charity fundraiser in the Last Year: Not on file  . Ran Out of Food in the Last Year: Not on file  Transportation Needs:   . Lack of Transportation (Medical): Not on file  . Lack of Transportation (Non-Medical): Not on file  Physical Activity:   . Days of Exercise per Week: Not on file  . Minutes of Exercise per Session: Not on file  Stress:   . Feeling of Stress : Not on file  Social Connections:   . Frequency of Communication with Friends and Family: Not on file  . Frequency of Social Gatherings with Friends and Family: Not on file  . Attends Religious Services: Not on file  . Active Member of Clubs or Organizations:  Not on file  . Attends Archivist Meetings: Not on file  . Marital Status: Not on file  Intimate Partner Violence:   . Fear of Current or Ex-Partner: Not on file  . Emotionally Abused: Not on file  . Physically Abused: Not on file  . Sexually Abused: Not on file    Environmental History: The patient lives in 60 year old house with hardwood floors throughout, gassy, and central air.  There is no known mold/water damage in the home.  There are no pets in the home.  She is a non-smoker.  Current Outpatient Medications  Medication Sig Dispense  Refill  . Ascorbic Acid (VITAMIN C) 100 MG tablet Take 100 mg by mouth daily.    . cholecalciferol (VITAMIN D3) 25 MCG (1000 UT) tablet Take 1,000 Units by mouth daily.    Marland Kitchen pyridOXINE (VITAMIN B-6) 100 MG tablet Take 100 mg by mouth daily.    . vitamin B-12 (CYANOCOBALAMIN) 100 MCG tablet Take 100 mcg by mouth daily.    Marland Kitchen azelastine (ASTELIN) 0.1 % nasal spray 1 spray per nostril 1-2 times daily as needed 30 mL 5  . fluticasone (FLONASE) 50 MCG/ACT nasal spray Place 1 spray into both nostrils 2 (two) times daily as needed for allergies or rhinitis. 18.2 mL 5   No current facility-administered medications for this visit.    Known medication allergies: Allergies  Allergen Reactions  . Compazine [Prochlorperazine Edisylate]   . Penicillins     I appreciate the opportunity to take part in Avalynne's care. Please do not hesitate to contact me with questions.  Sincerely,   R. Edgar Frisk, MD

## 2019-09-05 ENCOUNTER — Telehealth: Payer: Self-pay

## 2019-09-05 NOTE — Telephone Encounter (Signed)
Janet Goodwin, Janet Muta, MD 15 hours ago (7:35 PM)  BS Dr. Verlin Fester,        I wanted to thank you for your patience, kindness, and help today during my visit. I will try the treatment plan you outlined and hopefully get some results! If not, then perhaps a second opinion or evaluation at a multispecialty center.        I did have a question. How bad was my allergy to dust mite? And could this allergy be causing the post nasal drip, shortness of breath, and chest tightness? I do have these symptoms even at work. Could these symptoms last all day, everyday because of this allergy?             Thank you,                    Janet Goodwin  please advise

## 2019-09-08 NOTE — Telephone Encounter (Signed)
It was a pleasure to meet you the other day.  Regarding your question about dust mite allergy, epicutaneous testing to dust mite was negative and intradermal testing was borderline positive/equivocal.  Therefore, I strongly doubt that dust mite allergy is contributing to a significant degree to the severe and persistent symptoms you have been experiencing.  As discussed, there may be a nonallergic component which is contributing to the postnasal drainage.  The rather aggressive treatment aimed at postnasal drainage will address both nonallergic and allergic contributions. Again, it was a pleasure to have met you and please do not hesitate to contact me with further questions or concerns.

## 2019-09-08 NOTE — Telephone Encounter (Signed)
I have forwarded the message to pt thru my chart.

## 2019-09-09 ENCOUNTER — Ambulatory Visit: Payer: BC Managed Care – PPO | Admitting: Allergy and Immunology

## 2019-09-24 DIAGNOSIS — J398 Other specified diseases of upper respiratory tract: Secondary | ICD-10-CM | POA: Insufficient documentation

## 2019-09-24 HISTORY — DX: Other specified diseases of upper respiratory tract: J39.8

## 2019-11-18 ENCOUNTER — Ambulatory Visit: Payer: BC Managed Care – PPO | Admitting: Plastic Surgery

## 2019-11-18 ENCOUNTER — Other Ambulatory Visit: Payer: Self-pay

## 2019-11-18 ENCOUNTER — Encounter: Payer: Self-pay | Admitting: Plastic Surgery

## 2019-11-18 DIAGNOSIS — C4491 Basal cell carcinoma of skin, unspecified: Secondary | ICD-10-CM | POA: Insufficient documentation

## 2019-11-18 DIAGNOSIS — D239 Other benign neoplasm of skin, unspecified: Secondary | ICD-10-CM

## 2019-11-18 DIAGNOSIS — C44319 Basal cell carcinoma of skin of other parts of face: Secondary | ICD-10-CM

## 2019-11-18 HISTORY — DX: Basal cell carcinoma of skin, unspecified: C44.91

## 2019-11-18 MED ORDER — FLUOROURACIL 5 % EX CREA: TOPICAL_CREAM | Freq: Every day | CUTANEOUS | 0 refills | Status: DC

## 2019-11-18 NOTE — Progress Notes (Signed)
     Patient ID: Janet Goodwin, female    DOB: [birth year], 60 y.o.   MRN: [id number]   Chief Complaint  Patient presents with  . Advice Only    for remove bump from lip    HPI  Review of Systems  Constitutional: Negative.  Negative for activity change.  Eyes: Negative.   Respiratory: Negative for chest tightness.   Gastrointestinal: Negative.   Genitourinary: Negative.   Musculoskeletal: Negative.   Hematological: Negative.     Past Medical History:  Diagnosis Date  . Abnormal LFTs   . HLD (hyperlipidemia)     Past Surgical History:  Procedure Laterality Date  . TUBAL LIGATION Bilateral       Current Outpatient Medications:  .  Ascorbic Acid (VITAMIN C) 100 MG tablet, Take 100 mg by mouth daily., Disp: , Rfl:  .  cholecalciferol (VITAMIN D3) 25 MCG (1000 UT) tablet, Take 1,000 Units by mouth daily., Disp: , Rfl:  .  pyridOXINE (VITAMIN B-6) 100 MG tablet, Take 100 mg by mouth daily., Disp: , Rfl:  .  vitamin B-12 (CYANOCOBALAMIN) 100 MCG tablet, Take 100 mcg by mouth daily., Disp: , Rfl:    Objective:   Vitals:   11/18/19 1128  BP: 117/73  Pulse: 62  Temp: (!) 97.1 F (36.2 C)  SpO2: 100%    Physical Exam Vitals and nursing note reviewed.  Constitutional:      Appearance: Normal appearance.  HENT:     Head: Normocephalic and atraumatic.   Cardiovascular:     Rate and Rhythm: Normal rate.  Pulmonary:     Effort: Pulmonary effort is normal.  Skin:    General: Skin is warm.  Neurological:     General: No focal deficit present.     Mental Status: She is alert and oriented to person, place, and time.  Psychiatric:        Mood and Affect: Mood normal.        Behavior: Behavior normal.     Assessment & Plan:  Adnexal tumor  Basal cell carcinoma (BCC) of skin of other part of face  Recommend excision of right face adnexal tumor.  There will be a scar but should heal very well.  I recommend re-excision of the chest BCC since the biopsy was a  shave.  The patient wants to hold off on the excision and try a cream. I recommend 5FU.  Pictures were obtained of the patient and placed in the chart with the patient's or guardian's permission.  Wallace Going, DO   The 21st Century Cures Act was signed into law in 2016 which includes the topic of electronic health records.  This provides immediate access to information in MyChart.  This includes consultation notes, operative notes, office notes, lab results and pathology reports.  If you have any questions about what you read please let us know at your next visit or call us at the office.  We are right here with you.

## 2019-11-24 ENCOUNTER — Other Ambulatory Visit: Payer: Self-pay | Admitting: Internal Medicine

## 2019-11-24 DIAGNOSIS — R918 Other nonspecific abnormal finding of lung field: Secondary | ICD-10-CM

## 2019-12-02 ENCOUNTER — Institutional Professional Consult (permissible substitution): Payer: BC Managed Care – PPO | Admitting: Plastic Surgery

## 2019-12-23 ENCOUNTER — Other Ambulatory Visit: Payer: Self-pay

## 2019-12-23 ENCOUNTER — Ambulatory Visit (INDEPENDENT_AMBULATORY_CARE_PROVIDER_SITE_OTHER)
Admission: RE | Admit: 2019-12-23 | Discharge: 2019-12-23 | Disposition: A | Discharge status: Home / Self Care | Payer: BC Managed Care – PPO | Source: Ambulatory Visit | Attending: Internal Medicine | Admitting: Internal Medicine

## 2019-12-23 DIAGNOSIS — R918 Other nonspecific abnormal finding of lung field: Secondary | ICD-10-CM | POA: Diagnosis not present

## 2019-12-24 ENCOUNTER — Telehealth: Payer: Self-pay | Admitting: Internal Medicine

## 2019-12-24 NOTE — Progress Notes (Signed)
LMTCB

## 2019-12-24 NOTE — Telephone Encounter (Signed)
Janet Goodwin; call patient : Study is very reassuring, all nodules are same, radiology does not rec additional ct's in this setting as they are likely all from some prior exposure but no evidence at all of active process   Pulmonary f/u can be prn resp symptoms/ copy to Dr Jenny Reichmann sent  Left message for patient to call back.

## 2019-12-26 NOTE — Telephone Encounter (Signed)
ATC patient per DPR left detailed message. Nothing further needed at this time. 

## 2020-01-06 ENCOUNTER — Ambulatory Visit: Payer: BC Managed Care – PPO | Admitting: Plastic Surgery

## 2020-01-13 ENCOUNTER — Other Ambulatory Visit: Payer: Self-pay

## 2020-01-13 ENCOUNTER — Ambulatory Visit: Payer: BC Managed Care – PPO | Admitting: Plastic Surgery

## 2020-01-13 ENCOUNTER — Encounter: Payer: Self-pay | Admitting: Plastic Surgery

## 2020-01-13 ENCOUNTER — Other Ambulatory Visit (HOSPITAL_COMMUNITY)
Admission: RE | Admit: 2020-01-13 | Discharge: 2020-01-13 | Disposition: A | Discharge status: Home / Self Care | Payer: BC Managed Care – PPO | Source: Ambulatory Visit | Attending: Plastic Surgery | Admitting: Plastic Surgery

## 2020-01-13 VITALS — BP 114/76 | HR 61 | Temp 97.6°F | Ht 66.0 in | Wt 137.6 lb

## 2020-01-13 DIAGNOSIS — L989 Disorder of the skin and subcutaneous tissue, unspecified: Secondary | ICD-10-CM | POA: Diagnosis not present

## 2020-01-13 DIAGNOSIS — D2339 Other benign neoplasm of skin of other parts of face: Secondary | ICD-10-CM | POA: Diagnosis not present

## 2020-01-13 NOTE — Progress Notes (Signed)
Procedure Note  Preoperative Dx: Changing skin lesion right cheek  Postoperative Dx: Same  Procedure: Excision of changing skin lesion right cheek 9 mm  Anesthesia: Lidocaine 1% with 1:100,000 epinepherine  Indication for Procedure: Neoplasm  Description of Procedure: Risks and complications were explained to the patient.  Consent was confirmed and the patient understands the risks and benefits.  The potential complications and alternatives were explained and the patient consents.  The patient expressed understanding the option of not having the procedure and the risks of a scar.  Time out was called and all information was confirmed to be correct.    The area was prepped and drapped.  Lidocaine 1% with epinepherine was injected in the subcutaneous area.  After waiting several minutes for the local to take affect a #15 blade was used to excise the area in an eliptical pattern with 2 mm borders.  A 5-0 Monocryl was used to close the skin edges.  A dressing was applied.  The patient was given instructions on how to care for the area and a follow up appointment.  Keana tolerated the procedure well and there were no complications. The specimen was sent to pathology.

## 2020-01-14 LAB — SURGICAL PATHOLOGY

## 2020-01-16 ENCOUNTER — Ambulatory Visit: Payer: BC Managed Care – PPO | Admitting: Plastic Surgery

## 2020-01-20 ENCOUNTER — Ambulatory Visit: Payer: BC Managed Care – PPO | Admitting: Plastic Surgery

## 2020-01-23 ENCOUNTER — Other Ambulatory Visit: Payer: Self-pay

## 2020-01-23 ENCOUNTER — Ambulatory Visit (INDEPENDENT_AMBULATORY_CARE_PROVIDER_SITE_OTHER): Payer: BC Managed Care – PPO | Admitting: Plastic Surgery

## 2020-01-23 ENCOUNTER — Encounter: Payer: Self-pay | Admitting: Plastic Surgery

## 2020-01-23 VITALS — BP 111/67 | HR 59 | Temp 97.8°F

## 2020-01-23 DIAGNOSIS — D239 Other benign neoplasm of skin, unspecified: Secondary | ICD-10-CM

## 2020-01-23 NOTE — Progress Notes (Signed)
   Subjective:    Patient ID: Janet Goodwin, female    DOB: 24-Jan-1961, 59 y.o.   MRN: [id number]  The patient is a 59 year old female here for follow-up after undergoing excision of a lesion on her face.  The pathology showed hidradenoma.  The deep margin was not clear.  Has healed very nicely so far.  There is no sign of infection.      Review of Systems  Constitutional: Negative.   HENT: Negative.   Eyes: Negative.   Respiratory: Negative.   Cardiovascular: Negative.   Genitourinary: Negative.        Objective:   Physical Exam Vitals and nursing note reviewed.  Constitutional:      Appearance: Normal appearance.  HENT:     Head: Normocephalic.  Cardiovascular:     Rate and Rhythm: Normal rate.     Pulses: Normal pulses.  Neurological:     General: No focal deficit present.     Mental Status: She is alert. Mental status is at baseline.  Psychiatric:        Mood and Affect: Mood normal.        Behavior: Behavior normal.       Assessment & Plan:     ICD-10-CM   1. Adnexal tumor  D23.9     The sutures were removed.  A fresh Steri-Strip was applied.  Patient is encouraged to be really good about sunblock and sun avoidance.  Also recommend skin movement which she can start in a week.  If the area starts to appear he can come back and we will excise it to be sure the deep margin has been removed.  Another option is to go ahead and excise it now to obtain a negative deep margin.  The patient would like to wait and watch.  I think this is reasonable since it is a benign tumor.

## 2020-11-15 ENCOUNTER — Ambulatory Visit: Payer: BC Managed Care – PPO

## 2020-11-18 ENCOUNTER — Other Ambulatory Visit: Payer: Self-pay

## 2020-11-18 ENCOUNTER — Ambulatory Visit: Payer: BC Managed Care – PPO | Attending: Internal Medicine

## 2020-11-18 DIAGNOSIS — Z23 Encounter for immunization: Secondary | ICD-10-CM

## 2020-11-18 NOTE — Progress Notes (Signed)
   Covid-19 Vaccination Clinic  Name:  Janet Goodwin    MRN: [id number] DOB: May 17, 1961  11/18/2020  [name] was observed post Covid-19 immunization for 15 minutes without incident. She was provided with Vaccine Information Sheet and instruction to access the V-Safe system.   [name] was instructed to call 911 with any severe reactions post vaccine: Marland Kitchen Difficulty breathing  . Swelling of face and throat  . A fast heartbeat  . A bad rash all over body  . Dizziness and weakness   Immunizations Administered    Name Date Dose VIS Date Route   PFIZER Comrnaty(Gray TOP) Covid-19 Vaccine 11/18/2020 10:16 AM 0.3 mL 07/22/2020 Intramuscular   Manufacturer: [place]   Lot: [id number]   [place]: [phone]

## 2020-11-25 ENCOUNTER — Other Ambulatory Visit (HOSPITAL_BASED_OUTPATIENT_CLINIC_OR_DEPARTMENT_OTHER): Payer: Self-pay

## 2020-11-25 MED ORDER — COVID-19 MRNA VACCINE (PFIZER) 30 MCG/0.3ML IM SUSP
INTRAMUSCULAR | 0 refills | Status: DC
  Filled 2020-11-25: qty 0.3, 1d supply, fill #0

## 2021-02-12 IMAGING — DX DG CHEST 2V
2 series · 2 of 2 positions shown · non-contrast
Comparison: No prior.

CLINICAL DATA: Dyspnea.

EXAM:
CHEST - 2 VIEW

[chest pa]
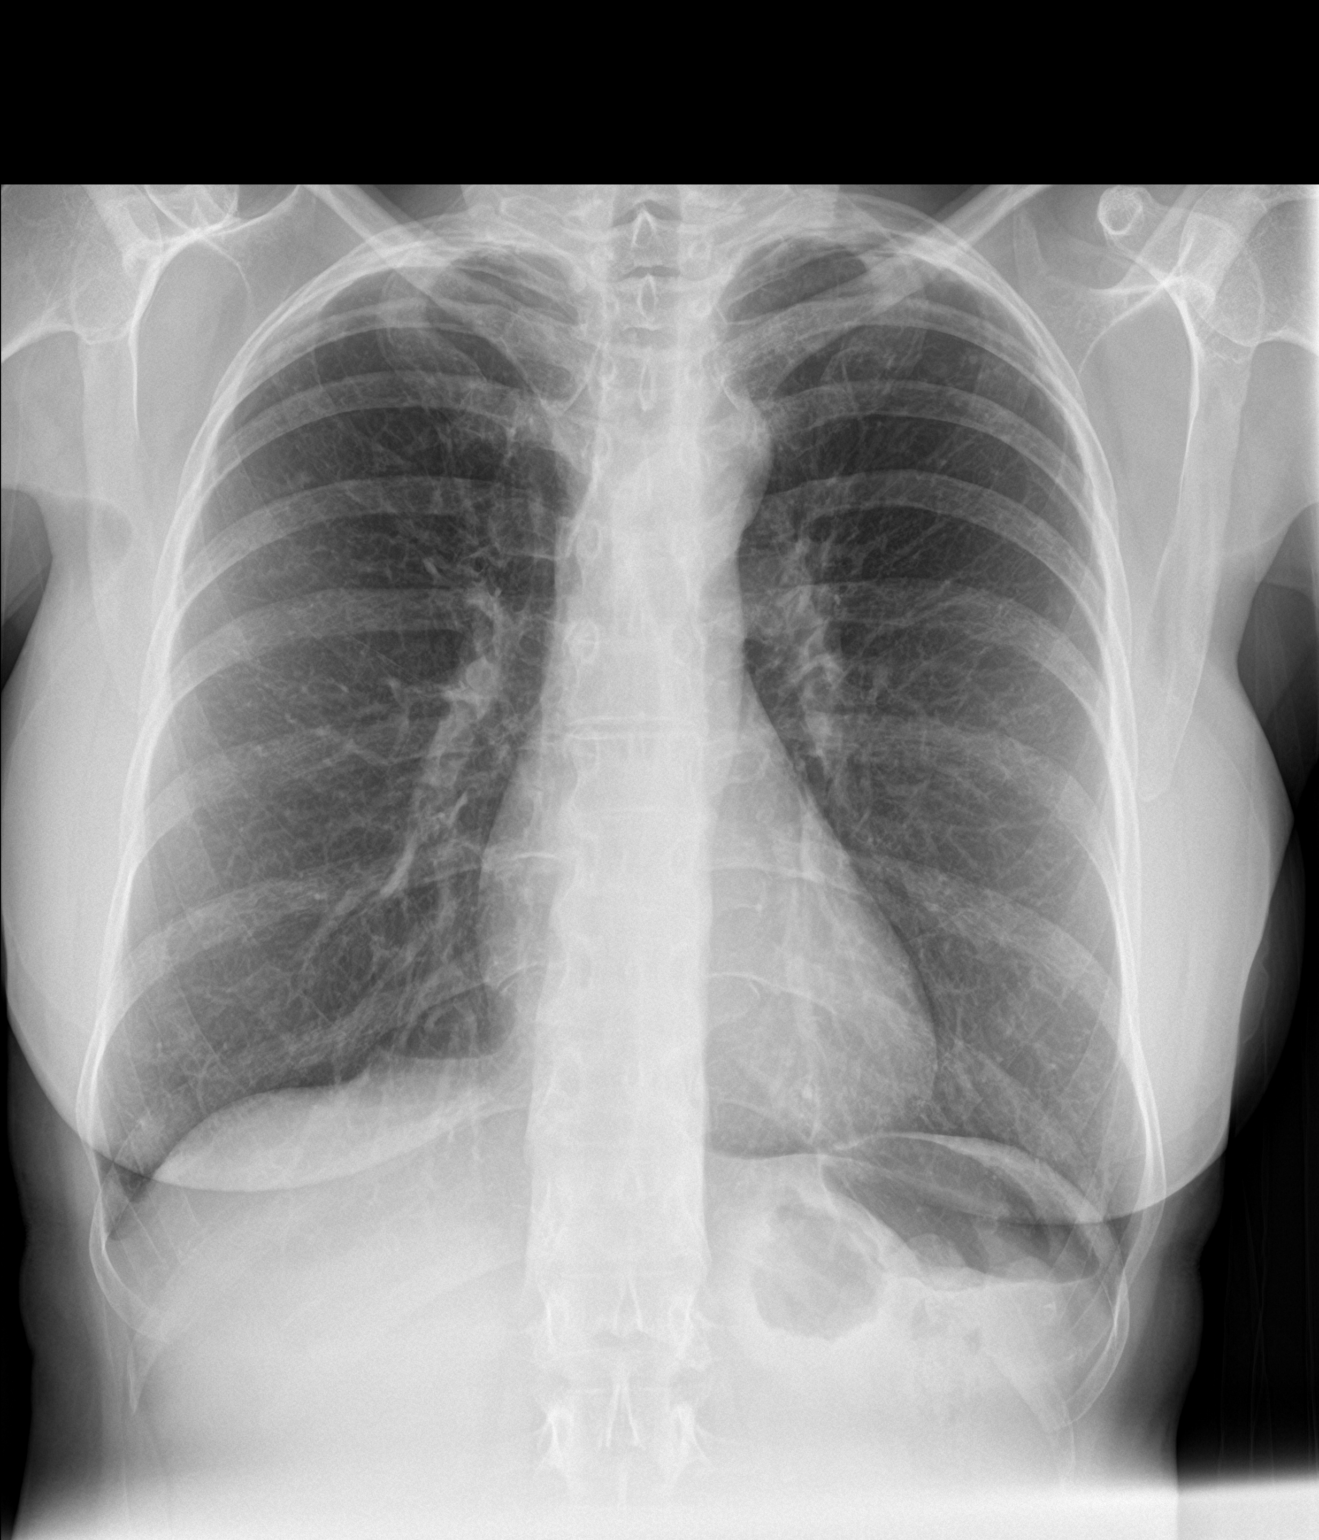

[chest lat]
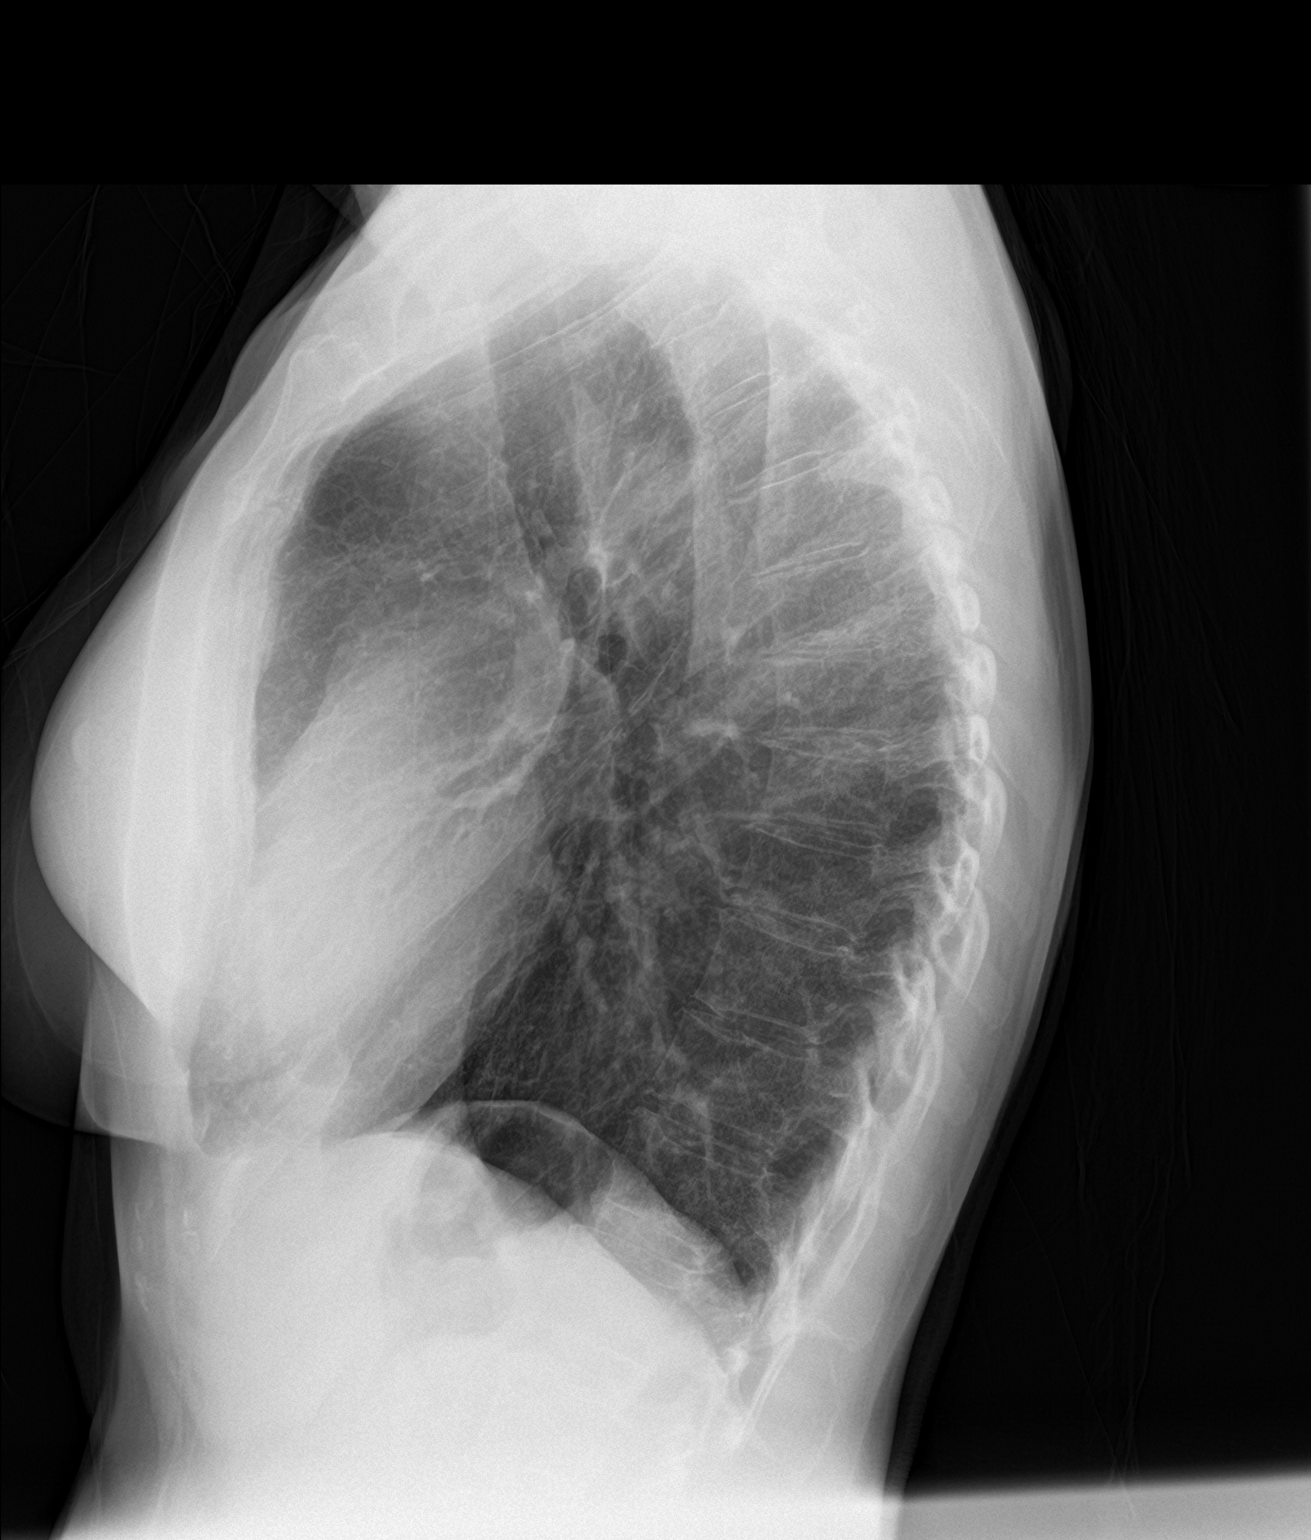

[2 of 2 positions shown; findings below may reference images not displayed]

FINDINGS: Mediastinum and hilar structures normal. Heart size normal. No
pulmonary venous congestion. Questionable nodular opacity noted over
the right upper lung. Repeat PA and lateral chest x-ray suggested.
If this nodular opacity persists nonenhanced chest CT suggested for
further evaluation. Small nodular opacity noted the right lung base,
most likely nipple shadow. Repeat PA and lateral chest x-ray should
be obtained with nipple markers. Tiny calcified nodule left upper
lung most likely calcified granuloma. No acute infiltrate. Biapical
mild pleuroparenchymal thickening most likely secondary to scarring.
No pleural effusion or pneumothorax. No acute bony abnormality.
Diffuse osteopenia degenerative change thoracic spine.
IMPRESSION: 1. Questionable tiny right apical pulmonary nodule. Repeat PA and
lateral chest x-ray suggested. If this nodular density persists
nonenhanced chest CT suggested for further evaluation. Small nodular
density noted over the right lung base, most likely nipple shadow.
Repeat PA lateral chest x-ray should be obtained with nipple
markers.

2. Tiny calcified nodule left upper lung, most likely tiny calcified
granuloma. Biapical pleural-parenchymal thickening consistent
scarring. No acute cardiopulmonary disease identified.

## 2021-05-23 DIAGNOSIS — J386 Stenosis of larynx: Secondary | ICD-10-CM

## 2021-05-23 HISTORY — DX: Stenosis of larynx: J38.6

## 2021-05-27 ENCOUNTER — Encounter: Payer: Self-pay | Admitting: Nurse Practitioner

## 2021-05-27 ENCOUNTER — Ambulatory Visit (INDEPENDENT_AMBULATORY_CARE_PROVIDER_SITE_OTHER): Payer: BC Managed Care – PPO | Admitting: Nurse Practitioner

## 2021-05-27 ENCOUNTER — Other Ambulatory Visit (INDEPENDENT_AMBULATORY_CARE_PROVIDER_SITE_OTHER): Payer: BC Managed Care – PPO

## 2021-05-27 VITALS — BP 120/62 | HR 66 | Ht 66.0 in | Wt 134.0 lb

## 2021-05-27 DIAGNOSIS — R1310 Dysphagia, unspecified: Secondary | ICD-10-CM

## 2021-05-27 DIAGNOSIS — Z800 Family history of malignant neoplasm of digestive organs: Secondary | ICD-10-CM

## 2021-05-27 DIAGNOSIS — R1012 Left upper quadrant pain: Secondary | ICD-10-CM

## 2021-05-27 DIAGNOSIS — R7989 Other specified abnormal findings of blood chemistry: Secondary | ICD-10-CM

## 2021-05-27 LAB — FERRITIN: Ferritin: 68.6 ng/mL (ref 10.0–291.0)

## 2021-05-27 LAB — IRON: Iron: 127 ug/dL (ref 42–145)

## 2021-05-27 NOTE — Patient Instructions (Signed)
IMAGING: You will be contacted by [facility] (Your caller ID will indicate phone # [phone]) in the next 7 days to schedule your Barium Swallow. If you have not heard from them within 7 business days, please call [facility] at [phone] to follow up on the status of your appointment.    LABS:  Lab work has been ordered for you today. Our lab is located in the basement. Press "B" on the elevator. The lab is located at the first door on the left as you exit the elevator.  HEALTHCARE LAWS AND MY CHART RESULTS: Due to recent changes in healthcare laws, you may see the results of your imaging and laboratory studies on MyChart before your provider has had a chance to review them.   We understand that in some cases there may be results that are confusing or concerning to you. Not all laboratory results come back in the same time frame and the provider may be waiting for multiple results in order to interpret others.  Please give [place] 48 hours in order for your provider to thoroughly review all the results before contacting the office for clarification of your results.   RECOMMENDATIONS: May start Pepcid 20 mg at bedtime, after the stool test has been completed. We have scheduled you a follow up with Dr. Carlean Purl on 07/26/21 at 9:30 am.  It was great seeing you today! Thank you for entrusting me with your care and choosing [facility].  Noralyn Pick, CRNP  The [facility] providers would like to encourage you to use [facility] to communicate with providers for non-urgent requests or questions.  Due to long hold times on the telephone, sending your provider a message by [facility] may be faster and more efficient way to get a response. Please allow 48 business hours for a response.  Please remember that this is for non-urgent requests/questions.  If you are age 71 or younger, your body mass index should be between 19-25. Your Body mass index  is 21.63 kg/m. If this is out of the aformentioned range listed, please consider follow up with your Primary Care Provider.

## 2021-05-27 NOTE — Progress Notes (Signed)
05/27/2021 Janet Goodwin [id number] 13-Sep-1960   CHIEF COMPLAINT: Abdominal pain   HISTORY OF PRESENT ILLNESS:  Janet Goodwin is a 60 year old female with a past medical history of hyperlipidemia, kidney stones, elevated LFTs, MVP and tracheal stenosis.  She presents to our office today self referred for further evaluation regarding dysphagia and stomach discomfort and bloat. She requested Dr. Carlean Purl to be her gastroenterologist. She endorses having dysphagia with solid foods such as chicken which gets stuck in the mid esophagus which passes down the esophagus in 10 to 20 seconds which occurs on and off for the past 10 years, approximately once monthly. She has stomach discomfort if she takes vitamins on an empty stomach and after drinking coffee on an empty stomach. No heartburn. She denies ever having an EGD. She developed SOB, struggled with inspiration which started during the Covid pandemic 09/2018. She was seen by pulmonologist Dr. Melvyn Novas and her evaluation was negative for asthma or other pulmonary disease. She was prescribed Dexilant for about 6 weeks for possible LPR/GERD without improvement. She saw ENT without evidence of vocal cord or sinus issues. She self diagnosed tracheal stenosis which was confirmed by specialist Dr. Addison Bailey at the [facility]. She received Kenalog injections with balloon dilation 10/2019 and her tracheal stenosis improved from 80% down to 10% then up to 20% August 2022.  She reported undergoing a colonoscopy in 2015 which showed a few polyps which were removed. She underwent a colonoscopy 04/23/2017 by Dr. Pia Mau which showed internal hemorrhoids, no polyps. Her mother was diagnosed with colon cancer at the age of 44. She is passing a normal formed brown bowel movement daily. No rectal bleeding or black stools.   She has a history of elevated LFTs. She presents with labs 02/24/2021 which showed an Alk Phos level of 140. AST 35. ALT  51. T. Bili 0.7. GGT 144. Hg 14.2. PLT 159. She drinks 2 glasses of wine every evening. No drug use. No prescription medication. She takes vitamins B, C and D. No herbal supplements. No NSAID use. Her maternal grandmother died from liver disease, further details unknown. Her sister has elevated LFTs and underwent a liver biopsy which possibly showed NAFLD. Her son also has elevated LFTs. No known family history of autoimmune liver disease. She denies ever see a liver specialist. Abdominal imaging has not been done.   Past Medical History:  Diagnosis Date   Abnormal LFTs    HLD (hyperlipidemia)    Past Surgical History:  Procedure Laterality Date   TUBAL LIGATION Bilateral   No problems with sedation/anesthesia. She has tracheal stenosis which increases her risk for respiratory complications and possibly difficulty with intubation.   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 She has 3 sons and 2 daughters. She is a non smoker. 2 glasses of wine 2 days. No drug use.   Family History: MGM died from liver disease. PGM with rheumatoid arthritis. Sister elevated LFTs required a liver biopsy. Son with elevated LFTs.  Allergies  Allergen Reactions   Compazine [Prochlorperazine Edisylate]    Penicillins       Outpatient Encounter Medications as of 05/27/2021  Medication Sig   Ascorbic Acid (VITAMIN C) 100 MG tablet Take 100 mg by mouth daily.   cholecalciferol (VITAMIN D3) 25 MCG (1000 UT) tablet Take 1,000 Units by mouth daily.   COVID-19 mRNA vaccine, Pfizer, 30 MCG/0.3ML injection Inject into the muscle.   fluorouracil (EFUDEX)  5 % cream Apply topically daily. Apply to chest daily for 6 weeks   pyridOXINE (VITAMIN B-6) 100 MG tablet Take 100 mg by mouth daily.   vitamin B-12 (CYANOCOBALAMIN) 100 MCG tablet Take 100 mcg by mouth daily.   No facility-administered encounter medications on file as of 05/27/2021.   REVIEW OF SYSTEMS: All other systems reviewed and negative except  where noted in the History of Present Illness. Gen: Denies fever, sweats or chills. No weight loss.  CV: Denies chest pain, palpitations or edema. Resp: See HPI. GI: See HPI.  GU : Denies urinary burning, blood in urine, increased urinary frequency or incontinence. MS: Denies joint pain, muscles aches or weakness. Derm: Denies rash, itchiness, skin lesions or unhealing ulcers. Psych: Denies depression, anxiety or memory loss. Heme: Denies bruising, bleeding. Neuro:  Denies headaches, dizziness or paresthesias. Endo:  Denies any problems with DM, thyroid or adrenal function.  PHYSICAL EXAM: BP 120/62   Pulse 66   Ht 5' 6"  (1.676 m)   Wt 134 lb (60.8 kg)   SpO2 98%   BMI 21.63 kg/m   General: 60 year old female in NAD.  Head: Normocephalic and atraumatic. Eyes:  Sclerae non-icteric, conjunctive pink. Ears: Normal auditory acuity. Mouth: Dentition intact. No ulcers or lesions.  Neck: Supple, no lymphadenopathy or thyromegaly.  Lungs: Clear bilaterally to auscultation without wheezes, crackles or rhonchi. Heart: Regular rate and rhythm. No murmur, rub or gallop appreciated.  Abdomen: Soft, nontender, non distended. No masses. No hepatosplenomegaly. Normoactive bowel sounds x 4 quadrants.  Rectal: Deferred. Musculoskeletal: Symmetrical with no gross deformities. Skin: Warm and dry. No rash or lesions on visible extremities. Extremities: No edema. Neurological: Alert oriented x 4, no focal deficits.  Psychological:  Alert and cooperative. Normal mood and affect.  ASSESSMENT AND PLAN:  6) 60 year old female with chronic esophageal dysphagia . No heartburn. Infrequent stomach discomfort.  -Barium swallow with tablet -I discussed scheduling an EGD at [facility] (hospital setting due to tracheal stenosis) for possible esophageal dilation and to rule out reflux esophagitis vs eosinophilic  esophagitis and UGI malignancy, await barium swallow results  -H. Pylori stool antigen -Famotidine  9m one po QD to start after H. Pylori stool test submitted to the lab  2) Reported history of colon polyp per colonoscopy in 2015, no polyp per colonoscopy 04/2017. Mother diagnosed with colon cancer at the age of 873 -Patient to provide copy of 2015 colonoscopy and biopsy report for further review  -Next colonoscopy due 04/2022  3) Elevated LFTs. Family history of liver disease, questionable NAFLD. -Hep B surface antigen, Hep B surface antibody, Hep A total antibody,  Hep C antibody, ANA, SMA, AMA, IgG, A1AT, ceruloplasmin, Iron and ferritin level -She preferred to schedule an abdominal sonogram after her next follow up appointment   Further recommendations to be determined after the above evaluation completed   Follow up in office with Dr. GCarlean Purlin 6 to 8 weeks     CC:  JBiagio Borg MD

## 2021-05-27 NOTE — Progress Notes (Signed)
RADIOLOGY SCHEDULING REQUEST FOR Barium Swallow with tablet [facility] Scheduling via secure staff message.

## 2021-05-30 ENCOUNTER — Other Ambulatory Visit: Payer: BC Managed Care – PPO

## 2021-05-30 DIAGNOSIS — Z800 Family history of malignant neoplasm of digestive organs: Secondary | ICD-10-CM

## 2021-05-30 DIAGNOSIS — R7989 Other specified abnormal findings of blood chemistry: Secondary | ICD-10-CM

## 2021-05-30 DIAGNOSIS — R1310 Dysphagia, unspecified: Secondary | ICD-10-CM

## 2021-05-30 DIAGNOSIS — R1012 Left upper quadrant pain: Secondary | ICD-10-CM

## 2021-05-31 LAB — ANA: Anti Nuclear Antibody (ANA): NEGATIVE

## 2021-05-31 LAB — CERULOPLASMIN: Ceruloplasmin: 30 mg/dL (ref 18–53)

## 2021-05-31 LAB — HEPATITIS A ANTIBODY, TOTAL: Hepatitis A AB,Total: NONREACTIVE

## 2021-05-31 LAB — IGG: IgG (Immunoglobin G), Serum: 917 mg/dL (ref 600–1540)

## 2021-05-31 LAB — ANTI-SMOOTH MUSCLE ANTIBODY, IGG: Actin (Smooth Muscle) Antibody (IGG): <20 U (ref <20)

## 2021-05-31 LAB — HEPATITIS B SURFACE ANTIBODY,QUALITATIVE: Hep B S Ab: BORDERLINE — AB

## 2021-05-31 LAB — MITOCHONDRIAL ANTIBODIES: Mitochondrial M2 Ab, IgG: <=20 U (ref <=20.0)

## 2021-05-31 LAB — HEPATITIS C ANTIBODY
Hepatitis C Ab: NONREACTIVE
SIGNAL TO CUT-OFF: 0 (ref <1.00)

## 2021-05-31 LAB — HEPATITIS B SURFACE ANTIGEN: Hepatitis B Surface Ag: NONREACTIVE

## 2021-05-31 LAB — HEPATITIS B CORE ANTIBODY, TOTAL: Hep B Core Total Ab: NONREACTIVE

## 2021-05-31 LAB — ALPHA-1-ANTITRYPSIN: A-1 Antitrypsin, Ser: 140 mg/dL (ref 83–199)

## 2021-06-01 LAB — H. PYLORI ANTIGEN, STOOL: H pylori Ag, Stl: NEGATIVE

## 2021-07-20 ENCOUNTER — Telehealth: Payer: Self-pay | Admitting: Internal Medicine

## 2021-07-20 DIAGNOSIS — R918 Other nonspecific abnormal finding of lung field: Secondary | ICD-10-CM

## 2021-07-21 NOTE — Telephone Encounter (Signed)
Per pt's last CT 12/24/19: Tanda Rockers, MD  12/24/2019  5:10 AM EDT     Janet Goodwin; call patient :  Study is very reassuring, all nodules are same, radiology does not rec additional ct's in this setting as they are likely all from some prior exposure but no evidence at all of active process    Pulmonary f/u can be prn resp symptoms/ copy to Dr Jenny Reichmann sent    Called and spoke with pt and stated to her the results per MW of her last CT that was done 12/2019.  Asked pt if she would still like to have another CT performed even though one is needing to be done due to others being reassuring and pt said that to ease her mind, she would like for another CT to be performed as well as scheduling a follow up appt after the CT to discuss results.  Dr. Melvyn Novas, please advise if you are okay with [place] ordering another CT on pt and getting her scheduled for an appt after.

## 2021-07-21 NOTE — Telephone Encounter (Signed)
Called and spoke to pt. Pt states this coming May (2023) will be 2 years since her last scan and wants to be sure that will be ok with Dr. Melvyn Novas. Pt had her last scan in 12/2019. Pt states she is ok waiting if Dr. Melvyn Novas is ok waiting until May.   Dr. Melvyn Novas, did you mean pt was to have scan last may (12/2020)? Or is she ok to wait till next may 2023? Thanks!

## 2021-07-21 NOTE — Telephone Encounter (Signed)
Ok to schedule for 01/01/21 and see me a day or two later

## 2021-07-21 NOTE — Telephone Encounter (Signed)
Order placed for the CT to be done 12/2021 and placed a note in there for pt to have a f/u scheduled with MW once we know the date of the CT due to his schedule for May 2023 being on hold at this time. Called and spoke with pt letting her know this had been done and she verbalized understanding. Nothing further needed.

## 2021-07-21 NOTE — Telephone Encounter (Signed)
I don't believe they are scheduling any longer for 12/2020 so I must have meant 12/2021

## 2021-07-26 ENCOUNTER — Ambulatory Visit: Payer: BC Managed Care – PPO | Admitting: Internal Medicine

## 2021-08-16 ENCOUNTER — Ambulatory Visit: Payer: BC Managed Care – PPO | Admitting: Internal Medicine

## 2021-09-02 ENCOUNTER — Ambulatory Visit: Payer: BC Managed Care – PPO | Admitting: Internal Medicine

## 2021-09-02 ENCOUNTER — Encounter: Payer: Self-pay | Admitting: Internal Medicine

## 2021-09-02 VITALS — BP 108/68 | HR 60 | Ht 66.0 in | Wt 137.0 lb

## 2021-09-02 DIAGNOSIS — R748 Abnormal levels of other serum enzymes: Secondary | ICD-10-CM | POA: Diagnosis not present

## 2021-09-02 DIAGNOSIS — R1310 Dysphagia, unspecified: Secondary | ICD-10-CM

## 2021-09-02 NOTE — Patient Instructions (Signed)
You will be contacted by [facility] in the next 2 days to arrange a Barium Swallow with tablet and also RUQ U/S.Marland Kitchen  The number on your caller ID will be [phone], please answer when they call.  If you have not heard from them in 2 days please call [phone] to schedule.    Due to recent changes in healthcare laws, you may see the results of your imaging and laboratory studies on MyChart before your provider has had a chance to review them.  We understand that in some cases there may be results that are confusing or concerning to you. Not all laboratory results come back in the same time frame and the provider may be waiting for multiple results in order to interpret others.  Please give [place] 48 hours in order for your provider to thoroughly review all the results before contacting the office for clarification of your results.   I appreciate the opportunity to care for you. Silvano Rusk, MD, [facility]

## 2021-09-02 NOTE — Progress Notes (Signed)
° °Janet Goodwin 60 y.o. 10/06/1960 [id number] ° °Assessment & Plan:  ° °Encounter Diagnoses  °Name Primary?  ° Abnormal alkaline phosphatase test Yes  ° Abnormal transaminases   ° Dysphagia, unspecified type   ° ° °The abnormal alk phos and abnormal transaminase issue is of unclear cause at this point.  The patient seems very healthy and we have no evidence for significant liver disease.  I am going to evaluate further with an ultrasound.  Consider fractionation of the alk phos, GGT 5 prime nucleotidase.  There is an interesting associated history of abnormal liver tests in the family and a sister and one of her sons and a grandmother died of some sort of liver disease.  It sounds like her sister might of had nonalcoholic fatty liver disease.  That can occur in thin people so it is in the realm of possibility here. ° °Regarding the dysphagia she will proceed with the barium swallow and tablet.  Perhaps there is relationship to her tracheal stenosis issues.  Should she need an endoscopy I think it would need to be done at the hospital given her airway issues. 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 ° °I appreciate the opportunity to care for this patient. °CC: Wile, Laura H, MD ° ° ° ° °Subjective:  ° °Chief Complaint: Abnormal liver chemistry, dysphagia ° °HPI °60-year-old white woman here for follow-up of abnormal liver tests and dysphagia.  Seen by Colleen Kennedy Smith, NP in October of last year, and had an extensive serologic work-up after reviewing abnormal transaminases and mildly elevated alk phos.  Also had complaints of years of intermittent episodic solid food dysphagia and a barium swallow was ordered but that has not been completed.  That note indicated  she had 2 glasses of wine every night but she has 2 glasses of wine on Friday and Saturday only.  She has never been a daily drinker.  Though she does have some stiffness she does not have any significant muscle aches, myalgia etc. ° °Lab tests resulted in no additional information i.e. all her autoimmune testing including mitochondrial antibodies, ANA, anti-smooth muscle antibodies were negative.  IgG was normal.  Ferritin normal.  Ceruloplasmin and alpha-1 antitrypsin negative.  She had a borderline reactive hepatitis B surface antibody and a negative hepatitis A antibody total.  Colleen had recommended immunization for both of these as a preventive measure.  Not yet done. ° °Recent lab testing is reviewed.  She brings copies with her today and I have reviewed in care everywhere as well. ° ° °On January 13 her AST was 33 which is normal ALT 62 with top normal 40 alk phos 238 and total bilirubin 0.7.  In July 2022 these numbers were 35, 51, 140 and 0.7.  Platelets are normal hemoglobin normal white count normal.  TSH normal.  She does have elevated total cholesterol and LDL but she has an HDL 104 and her triglyceride is 41 ° ° °Allergies  °Allergen Reactions  ° Compazine [Prochlorperazine Edisylate]   ° Penicillins   ° °Current Meds  °Medication Sig  ° Ascorbic Acid (VITAMIN C) 100 MG tablet Take 100 mg by mouth daily.  ° cholecalciferol (VITAMIN D3) 25 MCG (1000 UT) tablet Take 1,000 Units by mouth   daily.  ° pyridOXINE (VITAMIN B-6) 100 MG tablet Take 100 mg by mouth daily.  ° vitamin B-12 (CYANOCOBALAMIN) 100 MCG tablet Take 100 mcg by mouth daily.  ° [DISCONTINUED] COVID-19 mRNA vaccine, Pfizer, 30 MCG/0.3ML injection Inject into the muscle.  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 ° ° °Review of Systems °As per HPI ° °Objective:  ° Physical Exam °BP 108/68    Pulse 60    Ht 5' 6" (1.676 m)    Wt 137 lb (62.1 kg)    BMI 22.11 kg/m²  °Well-developed well-nourished white woman in no acute distress °Abdomen is flat soft and nontender without hepatosplenomegaly or mass ° °

## 2021-09-05 ENCOUNTER — Other Ambulatory Visit: Payer: Self-pay

## 2021-09-05 ENCOUNTER — Ambulatory Visit (HOSPITAL_COMMUNITY)
Admission: RE | Admit: 2021-09-05 | Discharge: 2021-09-05 | Disposition: A | Discharge status: Home / Self Care | Payer: BC Managed Care – PPO | Source: Ambulatory Visit | Attending: Internal Medicine | Admitting: Internal Medicine

## 2021-09-05 DIAGNOSIS — R748 Abnormal levels of other serum enzymes: Secondary | ICD-10-CM | POA: Insufficient documentation

## 2021-09-12 ENCOUNTER — Ambulatory Visit (HOSPITAL_COMMUNITY)
Admission: RE | Admit: 2021-09-12 | Discharge: 2021-09-12 | Disposition: A | Discharge status: Home / Self Care | Payer: BC Managed Care – PPO | Source: Ambulatory Visit | Attending: Nurse Practitioner | Admitting: Nurse Practitioner

## 2021-09-12 ENCOUNTER — Other Ambulatory Visit: Payer: Self-pay

## 2021-09-12 DIAGNOSIS — R7989 Other specified abnormal findings of blood chemistry: Secondary | ICD-10-CM | POA: Insufficient documentation

## 2021-09-12 DIAGNOSIS — R1310 Dysphagia, unspecified: Secondary | ICD-10-CM | POA: Insufficient documentation

## 2021-09-12 DIAGNOSIS — R1012 Left upper quadrant pain: Secondary | ICD-10-CM | POA: Insufficient documentation

## 2021-09-12 DIAGNOSIS — Z800 Family history of malignant neoplasm of digestive organs: Secondary | ICD-10-CM | POA: Diagnosis present

## 2021-11-25 ENCOUNTER — Other Ambulatory Visit: Payer: Self-pay | Admitting: Internal Medicine

## 2021-11-25 DIAGNOSIS — R748 Abnormal levels of other serum enzymes: Secondary | ICD-10-CM

## 2021-12-06 ENCOUNTER — Other Ambulatory Visit (INDEPENDENT_AMBULATORY_CARE_PROVIDER_SITE_OTHER): Payer: BC Managed Care – PPO

## 2021-12-06 DIAGNOSIS — R748 Abnormal levels of other serum enzymes: Secondary | ICD-10-CM | POA: Diagnosis not present

## 2021-12-06 LAB — HEPATIC FUNCTION PANEL
ALT: 46 U/L — ABNORMAL HIGH (ref 0–35)
AST: 32 U/L (ref 0–37)
Albumin: 5 g/dL (ref 3.5–5.2)
Alkaline Phosphatase: 168 U/L — ABNORMAL HIGH (ref 39–117)
Bilirubin, Direct: 0.1 mg/dL (ref 0.0–0.3)
Total Bilirubin: 0.5 mg/dL (ref 0.2–1.2)
Total Protein: 7.6 g/dL (ref 6.0–8.3)

## 2021-12-06 LAB — GAMMA GT: GGT: 165 U/L — ABNORMAL HIGH (ref 7–51)

## 2021-12-06 LAB — CK: Total CK: 56 U/L (ref 7–177)

## 2021-12-13 LAB — NUCLEOTIDASE, 5', BLOOD: 5-Nucleotidase: 16 U/L — ABNORMAL HIGH (ref 0–10)

## 2022-01-04 ENCOUNTER — Ambulatory Visit (INDEPENDENT_AMBULATORY_CARE_PROVIDER_SITE_OTHER)
Admission: RE | Admit: 2022-01-04 | Discharge: 2022-01-04 | Disposition: A | Discharge status: Home / Self Care | Payer: BC Managed Care – PPO | Source: Ambulatory Visit | Attending: Internal Medicine | Admitting: Internal Medicine

## 2022-01-04 DIAGNOSIS — R918 Other nonspecific abnormal finding of lung field: Secondary | ICD-10-CM | POA: Diagnosis not present

## 2022-01-05 ENCOUNTER — Telehealth: Payer: Self-pay | Admitting: Internal Medicine

## 2022-01-06 NOTE — Progress Notes (Signed)
Called pt and there was no answer- left detailed msg with results ok per DPR.

## 2022-01-06 NOTE — Telephone Encounter (Signed)
ATC patient. No answer. CT results available:   Tanda Rockers, MD  01/05/2022  4:34 PM EDT     Call patient :  Study is unchanged c/w benign nodules, no more CTs  needed   Copy to PCP

## 2022-01-10 NOTE — Telephone Encounter (Signed)
Called pt and there was no answer-LMTCB °

## 2022-01-12 ENCOUNTER — Encounter: Payer: Self-pay | Admitting: Plastic Surgery

## 2022-01-12 ENCOUNTER — Ambulatory Visit: Payer: BC Managed Care – PPO | Admitting: Plastic Surgery

## 2022-01-12 DIAGNOSIS — L988 Other specified disorders of the skin and subcutaneous tissue: Secondary | ICD-10-CM

## 2022-01-12 DIAGNOSIS — L989 Disorder of the skin and subcutaneous tissue, unspecified: Secondary | ICD-10-CM

## 2022-01-12 NOTE — Progress Notes (Unsigned)
   Subjective:    Patient ID: Janet Goodwin, female    DOB: [birth year], 61 y.o.   MRN: [id number]  The patient is a lovely 61 year old female here for evaluation of her skin.  She had some areas of concern and used 5-FU on the     Review of Systems  Constitutional: Negative.   Eyes: Negative.   Respiratory: Negative.    Cardiovascular: Negative.   Gastrointestinal: Negative.   Endocrine: Negative.   Genitourinary: Negative.   Skin:  Positive for color change and wound.      Objective:   Physical Exam Constitutional:      Appearance: Normal appearance.  Cardiovascular:     Rate and Rhythm: Normal rate.  Skin:    Capillary Refill: Capillary refill takes less than 2 seconds.     Coloration: Skin is not jaundiced.     Findings: Erythema present. No bruising or lesion.  Neurological:     Mental Status: She is alert and oriented to person, place, and time.  Psychiatric:        Mood and Affect: Mood normal.        Behavior: Behavior normal.        Thought Content: Thought content normal.        Judgment: Judgment normal.          Assessment & Plan:

## 2022-01-13 ENCOUNTER — Encounter: Payer: Self-pay | Admitting: Plastic Surgery

## 2022-01-13 DIAGNOSIS — L989 Disorder of the skin and subcutaneous tissue, unspecified: Secondary | ICD-10-CM | POA: Insufficient documentation

## 2022-02-08 NOTE — Telephone Encounter (Signed)
Pt was made aware of CT results via a detailed message that was left for pt per DPR. Nothing further needed.

## 2022-03-17 ENCOUNTER — Institutional Professional Consult (permissible substitution): Payer: BC Managed Care – PPO | Admitting: Plastic Surgery

## 2022-03-23 ENCOUNTER — Telehealth: Payer: Self-pay | Admitting: Internal Medicine

## 2022-03-23 NOTE — Telephone Encounter (Signed)
Spoke to patient again about abnormal alkaline phosphatase  Reviewed rare possibilities of serologic negative PSC and PBC  We discussed possible MRCP and liver biopsy toexclude these - she has a family history of elevated alk phos of unclearetiology and does not want to pursue further investigation    She has an elderly mother w/ hx colon cancer and another relative w/ colon cancer and requests a screening colonoscopy - last was in 2018 (elsewhere) and negative   Please arrange screening colonoscopy - Family history at her convenience

## 2022-03-23 NOTE — Telephone Encounter (Unsigned)
Left message for pt to call back  °

## 2022-03-24 NOTE — Telephone Encounter (Unsigned)
Left message for pt to call back  °

## 2022-03-27 NOTE — Telephone Encounter (Signed)
Pt contacted to make aware of Dr. Carlean Purl recommendations to schedule screening colonoscopy:  Pt stated that she feels that her procedure will have to be done in the hospital due to being diagnosed with Subglottic Stenosis: Pt states that she will schedule an office visit with her Dr. To access her current condition: Pt stated that she would like to be scheduled for a 7:30 AM Case at the Hospital if possible  Pt was made aware that the Drs have limited availability at the hospital:  Pt verbalized understanding with all questions answered.

## 2022-03-28 NOTE — Telephone Encounter (Signed)
She is correct about hospital Will add to list

## 2022-04-05 ENCOUNTER — Telehealth: Payer: Self-pay | Admitting: Internal Medicine

## 2022-04-05 ENCOUNTER — Other Ambulatory Visit: Payer: Self-pay

## 2022-04-05 DIAGNOSIS — Z800 Family history of malignant neoplasm of digestive organs: Secondary | ICD-10-CM

## 2022-04-05 NOTE — Telephone Encounter (Signed)
Please set up colonoscopy for 730 on November 2 at [facility].  Patient is able to make that she tells me.  Set up a previsit also.  Generic Suprep.  Diagnosis is family history of colon cancer.  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

## 2022-04-05 NOTE — Telephone Encounter (Signed)
Pt notified via mychart. Previsit scheduled for 06/05/22 at 10am. Colon scheduled at [facility] 06/15/22'@7'$ :30am.

## 2022-06-05 ENCOUNTER — Ambulatory Visit (AMBULATORY_SURGERY_CENTER): Payer: Self-pay

## 2022-06-05 VITALS — Ht 66.0 in | Wt 128.0 lb

## 2022-06-05 DIAGNOSIS — Z800 Family history of malignant neoplasm of digestive organs: Secondary | ICD-10-CM

## 2022-06-05 MED ORDER — NA SULFATE-K SULFATE-MG SULF 17.5-3.13-1.6 GM/177ML PO SOLN: 1.0000 | Freq: Once | ORAL | 0 refills | Status: AC

## 2022-06-05 NOTE — Progress Notes (Signed)
No egg or soy allergy known to patient  No issues known to pt with past sedation with any surgeries or procedures Patient denies ever being told they had issues or difficulty with intubation  No FH of Malignant Hyperthermia Pt is not on diet pills Pt is not on  home 02  Pt is not on blood thinners  Pt denies issues with constipation  No A fib or A flutter Have any cardiac testing pending--no Pt instructed to use [email or web address] or GoodRx for a price reduction on prep   Pre visit was completed over the phone

## 2022-06-08 ENCOUNTER — Encounter (HOSPITAL_COMMUNITY): Payer: Self-pay | Admitting: Internal Medicine

## 2022-06-08 NOTE — Progress Notes (Signed)
Attempted to obtain medical history via telephone, unable to reach at this time. HIPAA compliant voicemail message left requesting return call to pre surgical testing department. 

## 2022-06-09 ENCOUNTER — Encounter: Payer: Self-pay | Admitting: Internal Medicine

## 2022-06-14 NOTE — Anesthesia Preprocedure Evaluation (Signed)
Anesthesia Evaluation  Patient identified by MRN, date of birth, ID band Patient awake    Reviewed: Allergy & Precautions, NPO status , Patient's Chart, lab work & pertinent test results  Airway Mallampati: I  TM Distance: >3 FB Neck ROM: Full    Dental  (+) Teeth Intact, Dental Advisory Given   Pulmonary neg pulmonary ROS   Pulmonary exam normal breath sounds clear to auscultation       Cardiovascular negative cardio ROS Normal cardiovascular exam Rhythm:Regular Rate:Normal     Neuro/Psych negative neurological ROS     GI/Hepatic Neg liver ROS,,,family hx of colon cancer   Endo/Other  negative endocrine ROS    Renal/GU negative Renal ROS     Musculoskeletal  (+) Arthritis ,    Abdominal   Peds  Hematology negative hematology ROS (+)   Anesthesia Other Findings Subglottic stenosis  Reproductive/Obstetrics                             Anesthesia Physical Anesthesia Plan  ASA: 2  Anesthesia Plan: MAC   Post-op Pain Management: Minimal or no pain anticipated   Induction:   PONV Risk Score and Plan: 2 and TIVA and Treatment may vary due to age or medical condition  Airway Management Planned: Natural Airway and Simple Face Mask  Additional Equipment:   Intra-op Plan:   Post-operative Plan:   Informed Consent: I have reviewed the patients History and Physical, chart, labs and discussed the procedure including the risks, benefits and alternatives for the proposed anesthesia with the patient or authorized representative who has indicated his/her understanding and acceptance.     Dental advisory given  Plan Discussed with: CRNA  Anesthesia Plan Comments:         Anesthesia Quick Evaluation

## 2022-06-15 ENCOUNTER — Other Ambulatory Visit: Payer: Self-pay

## 2022-06-15 ENCOUNTER — Ambulatory Visit (HOSPITAL_COMMUNITY): Payer: BC Managed Care – PPO | Admitting: Anesthesiology

## 2022-06-15 ENCOUNTER — Ambulatory Visit (HOSPITAL_COMMUNITY)
Admission: RE | Admit: 2022-06-15 | Discharge: 2022-06-15 | Disposition: A | Discharge status: Home / Self Care | Payer: BC Managed Care – PPO | Attending: Internal Medicine | Admitting: Internal Medicine

## 2022-06-15 ENCOUNTER — Encounter (HOSPITAL_COMMUNITY)
Admission: RE | Disposition: A | Discharge status: Home / Self Care | Payer: Self-pay | Source: Home / Self Care | Attending: Internal Medicine

## 2022-06-15 ENCOUNTER — Encounter (HOSPITAL_COMMUNITY): Payer: Self-pay | Admitting: Internal Medicine

## 2022-06-15 DIAGNOSIS — Q438 Other specified congenital malformations of intestine: Secondary | ICD-10-CM | POA: Insufficient documentation

## 2022-06-15 DIAGNOSIS — Z1211 Encounter for screening for malignant neoplasm of colon: Secondary | ICD-10-CM | POA: Insufficient documentation

## 2022-06-15 DIAGNOSIS — Z86010 Personal history of colonic polyps: Secondary | ICD-10-CM | POA: Insufficient documentation

## 2022-06-15 DIAGNOSIS — K6289 Other specified diseases of anus and rectum: Secondary | ICD-10-CM | POA: Insufficient documentation

## 2022-06-15 DIAGNOSIS — Z800 Family history of malignant neoplasm of digestive organs: Secondary | ICD-10-CM

## 2022-06-15 HISTORY — PX: COLONOSCOPY WITH PROPOFOL: SHX5780

## 2022-06-15 HISTORY — DX: Gastro-esophageal reflux disease without esophagitis: K21.9

## 2022-06-15 SURGERY — COLONOSCOPY WITH PROPOFOL
Anesthesia: Monitor Anesthesia Care

## 2022-06-15 MED ORDER — PROPOFOL 500 MG/50ML IV EMUL
INTRAVENOUS | Status: DC | PRN
  Administered 2022-06-15: 125 ug/kg/min via INTRAVENOUS

## 2022-06-15 MED ORDER — LACTATED RINGERS IV SOLN: INTRAVENOUS | Status: DC

## 2022-06-15 MED ORDER — SODIUM CHLORIDE 0.9 % IV SOLN: INTRAVENOUS | Status: DC

## 2022-06-15 MED ORDER — PROPOFOL 10 MG/ML IV BOLUS
INTRAVENOUS | Status: DC | PRN
  Administered 2022-06-15 (×4): 20 mg via INTRAVENOUS

## 2022-06-15 MED ORDER — LIDOCAINE 2% (20 MG/ML) 5 ML SYRINGE
INTRAMUSCULAR | Status: DC | PRN
  Administered 2022-06-15: 80 mg via INTRAVENOUS

## 2022-06-15 MED ORDER — PROPOFOL 1000 MG/100ML IV EMUL
INTRAVENOUS | Status: AC
  Filled 2022-06-15: qty 100

## 2022-06-15 SURGICAL SUPPLY — 22 items

## 2022-06-15 NOTE — Transfer of Care (Signed)
Immediate Anesthesia Transfer of Care Note  Patient: Janet Goodwin  Procedure(s) Performed: COLONOSCOPY WITH PROPOFOL  Patient Location: PACU  Anesthesia Type:MAC  Level of Consciousness: awake, alert , and oriented  Airway & Oxygen Therapy: Patient Spontanous Breathing  Post-op Assessment: Report given to RN and Post -op Vital signs reviewed and stable  Post vital signs: Reviewed and stable  Last Vitals:  Vitals Value Taken Time  BP    Temp    Pulse 57 06/15/22 0823  Resp 12 06/15/22 0823  SpO2 100 % 06/15/22 0823  Vitals shown include unvalidated device data.  Last Pain:  Vitals:   06/15/22 0644  TempSrc: Temporal  PainSc: 0-No pain         Complications: No notable events documented.

## 2022-06-15 NOTE — H&P (Signed)
[facility] History and Physical   Primary Care Physician:  Michael Boston, MD   Reason for Procedure:   Hx polyp, FHx CRCA  Plan:    colonoscopy     HPI: Janet Goodwin is a 61 y.o. 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 Authorizing Provider  Cholecalciferol (VITAMIN D) 50 MCG (2000 UT) tablet Take 2,000 Units by mouth daily.   Yes [provider]  Cyanocobalamin (VITAMIN B-12 PO) Take 1 tablet by mouth daily.   Yes [provider]  Multiple Vitamin (MULTIVITAMIN) capsule Take 1 capsule by mouth 2 (two) times a week.   Yes [provider]    Current Facility-Administered Medications  Medication Dose Route Frequency Provider Last Rate Last Admin   0.9 %  sodium chloride infusion   Intravenous Continuous Gatha Mayer, MD       lactated ringers infusion   Intravenous Continuous Gatha Mayer, MD 10 mL/hr at 06/15/22 9935 New Bag at 06/15/22 7017    Allergies as of 04/05/2022 - Review Complete 01/13/2022  Allergen Reaction Noted   Compazine [prochlorperazine edisylate]  10/29/2018   Penicillins Hives 10/29/2018   Prochlorperazine Other (See Comments) 10/29/2018    Family History  Problem Relation Age of Onset   Colon polyps Mother     Colon cancer Mother    Hyperlipidemia Mother    Aortic stenosis Father    Rheum arthritis Paternal Grandmother    Colon cancer Paternal Grandfather    Allergic rhinitis Neg Hx    Angioedema Neg Hx    Asthma Neg Hx    Eczema Neg Hx    Immunodeficiency Neg Hx    Urticaria Neg Hx    Esophageal cancer Neg Hx    Pancreatic cancer Neg Hx    Liver cancer Neg Hx    Rectal cancer Neg Hx    Stomach cancer Neg Hx     Social History   Socioeconomic History   Marital status: Married    Spouse name: Not on file   Number of children: Not on file   Years of education: Not on file   Highest education level: Not on file  Occupational History   Not on file  Tobacco Use   Smoking status: Never   Smokeless tobacco: Never  Vaping Use   Vaping Use: Never used  Substance and Sexual Activity   Alcohol use: Yes    Alcohol/week: 1.0 standard drink of alcohol    Types: 1 Glasses of wine per week   Drug use: Never   Sexual activity: Not on file  Other Topics Concern   Not on file  Social History Narrative   Married, 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  Physical Exam: Vital signs Temp 97.8 F (36.6 C) (Temporal)   Resp 16   Ht '5\' 6"'$  (1.676 m)   Wt 58.1 kg   SpO2 100%   BMI 20.66 kg/m   General:   Alert,  Well-developed, well-nourished, pleasant and cooperative in NAD Lungs:  Clear throughout to auscultation.   Heart:  Regular rate and rhythm; no murmurs, clicks, rubs,  or  gallops. Abdomen:  Soft, nontender and nondistended. Normal bowel sounds.   Neuro/Psych:  Alert and cooperative. Normal mood and affect. A and O x 3   '@Kadeisha Betsch'$  Simonne Maffucci, MD, [facility] [phone] (pager) 06/15/2022 7:36 AM@

## 2022-06-15 NOTE — Discharge Instructions (Signed)
Essentially normal exam today - no polyps. Some hypertrophied anal papillae seen.  Repeat colonoscopy 5 to 10 years - will place a 5 year recall.  I appreciate the opportunity to care for you. Gatha Mayer, MD, FACG  YOU HAD AN ENDOSCOPIC PROCEDURE TODAY: Refer to the procedure report and other information in the discharge instructions given to you for any specific questions about what was found during the examination. If this information does not answer your questions, please call Dr. Celesta Aver office at [phone] to clarify.   YOU SHOULD EXPECT: Some feelings of bloating in the abdomen. Passage of more gas than usual. Walking can help get rid of the air that was put into your GI tract during the procedure and reduce the bloating. If you had a lower endoscopy (such as a colonoscopy or flexible sigmoidoscopy) you may notice spotting of blood in your stool or on the toilet paper. Some abdominal soreness may be present for a day or two, also.  DIET: Your first meal following the procedure should be a light meal and then it is ok to progress to your normal diet. A half-sandwich or bowl of soup is an example of a good first meal. Heavy or fried foods are harder to digest and may make you feel nauseous or bloated. Drink plenty of fluids but you should avoid alcoholic beverages for 24 hours.   ACTIVITY: Your care partner should take you home directly after the procedure. You should plan to take it easy, moving slowly for the rest of the day. You can resume normal activity the day after the procedure however YOU SHOULD NOT DRIVE, use power tools, machinery or perform tasks that involve climbing or major physical exertion for 24 hours (because of the sedation medicines used during the test).   SYMPTOMS TO REPORT IMMEDIATELY: A gastroenterologist can be reached at any hour. Please call [phone]  for any of the following symptoms:  Following lower endoscopy (colonoscopy, flexible  sigmoidoscopy) Excessive amounts of blood in the stool  Significant tenderness, worsening of abdominal pains  Swelling of the abdomen that is new, acute  Fever of 100 or higher

## 2022-06-15 NOTE — Anesthesia Postprocedure Evaluation (Signed)
Anesthesia Post Note  Patient: Janet Goodwin  Procedure(s) Performed: COLONOSCOPY WITH PROPOFOL     Patient location during evaluation: Endoscopy Anesthesia Type: MAC Level of consciousness: oriented, awake and alert and awake Pain management: pain level controlled Vital Signs Assessment: post-procedure vital signs reviewed and stable Respiratory status: spontaneous breathing, nonlabored ventilation, respiratory function stable and patient connected to nasal cannula oxygen Cardiovascular status: blood pressure returned to baseline and stable Postop Assessment: no headache, no backache and no apparent nausea or vomiting Anesthetic complications: no   No notable events documented.  Last Vitals:  Vitals:   06/15/22 0830 06/15/22 0839  BP: 121/69 (!) 109/59  Pulse: (!) 58 (!) 55  Resp: 15 (!) 21  Temp: 36.7 C   SpO2: 100% 100%    Last Pain:  Vitals:   06/15/22 0839  TempSrc:   PainSc: 0-No pain                 Santa Lighter

## 2022-06-15 NOTE — Progress Notes (Signed)
Abdominal pressure applied by endo Tech, per Dr Celesta Aver instruction.

## 2022-06-15 NOTE — Op Note (Addendum)
[facility] Patient Name: Janet Goodwin Procedure Date: 06/15/2022 MRN: [id number] Attending MD: Gatha Mayer , MD, [id number] Date of Birth: June 11, 1961 CSN: [id number] Age: 61 Admit Type: Outpatient Procedure:                Colonoscopy Indications:              Surveillance: Personal history of adenomatous                            polyps on last colonoscopy > 5 years ago, Last                            colonoscopy: 2017 Also elderly mother 2652) dx CRCA                            + paternal grandfather (69's) Providers:                Gatha Mayer, MD, Mikey College, RN, Darliss Cheney, Technician, [facility], CRNA Referring MD:              Medicines:                Monitored Anesthesia Care Complications:            No immediate complications. Estimated Blood Loss:     Estimated blood loss: none. Procedure:                Pre-Anesthesia Assessment:                           - Prior to the procedure, a History and Physical                            was performed, and patient medications and                            allergies were reviewed. The patient's tolerance of                            previous anesthesia was also reviewed. The risks                            and benefits of the procedure and the sedation                            options and risks were discussed with the patient.                            All questions were answered, and informed consent                            was obtained. Prior Anticoagulants: The patient has  taken no anticoagulant or antiplatelet agents. ASA                            Grade Assessment: II - A patient with mild systemic                            disease. After reviewing the risks and benefits,                            the patient was deemed in satisfactory condition to                            undergo the procedure.                            After obtaining informed consent, the colonoscope                            was passed under direct vision. Throughout the                            procedure, the patient's blood pressure, pulse, and                            oxygen saturations were monitored continuously. The                            [id number]-HQ190L ([id number]) Olympus colonoscope was                            introduced through the anus and advanced to the the                            cecum, identified by appendiceal orifice and                            ileocecal valve. The colonoscopy was somewhat                            difficult due to a redundant colon and significant                            looping. Successful completion of the procedure was                            aided by straightening and shortening the scope to                            obtain bowel loop reduction and applying abdominal                            pressure. The patient tolerated the procedure well.  The quality of the bowel preparation was good. The                            ileocecal valve, appendiceal orifice, and rectum                            were photographed. The bowel preparation used was                            SUPREP via split dose instruction. Scope In: 7:48:04 AM Scope Out: 8:16:06 AM Scope Withdrawal Time: 0 hours 16 minutes 20 seconds  Total Procedure Duration: 0 hours 28 minutes 2 seconds  Findings:      The perianal and digital rectal examinations were normal.      Anal papilla(e) were hypertrophied.      The exam was otherwise without abnormality on direct and retroflexion       views. Impression:               - Anal papilla(e) were hypertrophied.                           - The examination was otherwise normal on direct                            and retroflexion views.                           - No specimens collected.                           - Personal history of  colonic polyp - adenoma 2014.                            Elderly mother also had CRCa (dx in 15) + paternal;                            grandfather (10's) Moderate Sedation:      Not Applicable - Patient had care per Anesthesia. Recommendation:           - Patient has a contact number available for                            emergencies. The signs and symptoms of potential                            delayed complications were discussed with the                            patient. Return to normal activities tomorrow.                            Written discharge instructions were provided to the                            patient.                           -  Resume previous diet.                           - Continue present medications.                           - Repeat colonoscopy in 5 years. Consider at that                            time - could probably go longer. Procedure Code(s):        --- Professional ---                           S9702, Colorectal cancer screening; colonoscopy on                            individual at high risk Diagnosis Code(s):        --- Professional ---                           Z86.010, Personal history of colonic polyps                           K62.89, Other specified diseases of anus and rectum CPT copyright 2022 [employer] Association. All rights reserved. The codes documented in this report are preliminary and upon coder review may  be revised to meet current compliance requirements. Gatha Mayer, MD 06/15/2022 8:26:58 AM This report has been signed electronically. Number of Addenda: 0

## 2022-06-18 ENCOUNTER — Encounter (HOSPITAL_COMMUNITY): Payer: Self-pay | Admitting: Internal Medicine

## 2023-08-31 IMAGING — CT CT CHEST W/O CM
2 of 4 series · 15 of 36 positions shown, 18 images · non-contrast
Comparison: Chest CT 12/23/2019 and 06/25/2019.

CLINICAL DATA: Follow up pulmonary nodules.



[Series 2: thorax · axial · 0.63mm/px · z∈[-315,-51]mm · 12 of 156 slices shown, 15 images]
[im 12/156  mediastinal]
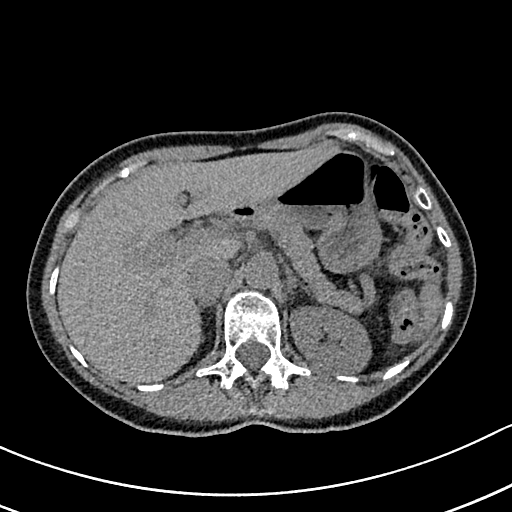
[im 12/156  lung]
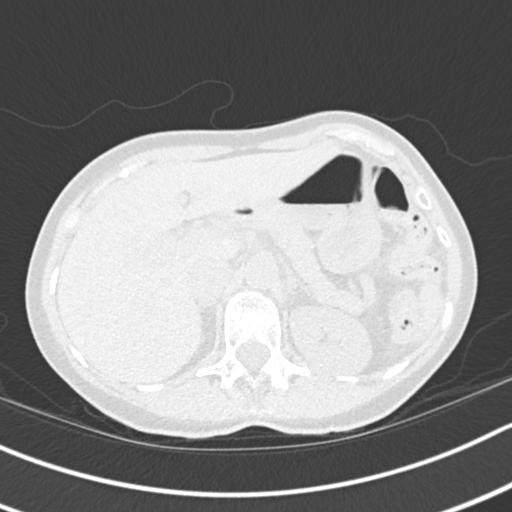
[im 24/156  lung]
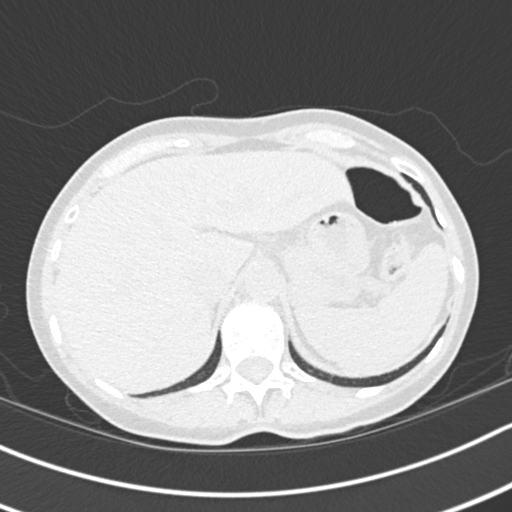
[im 36/156  lung]
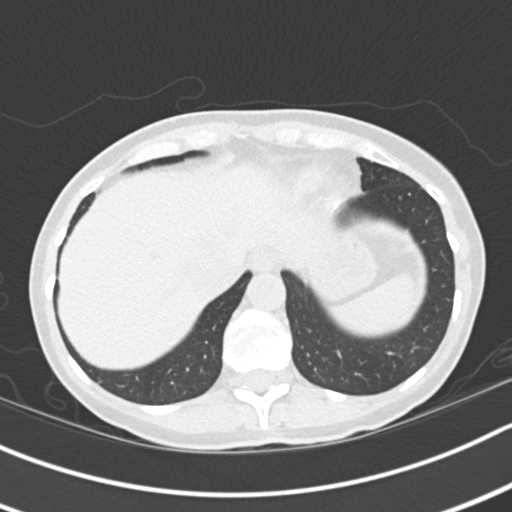
[im 48/156  lung]
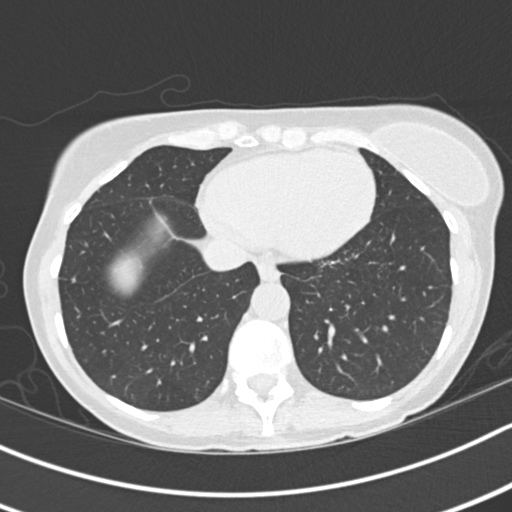
[im 60/156  mediastinal]
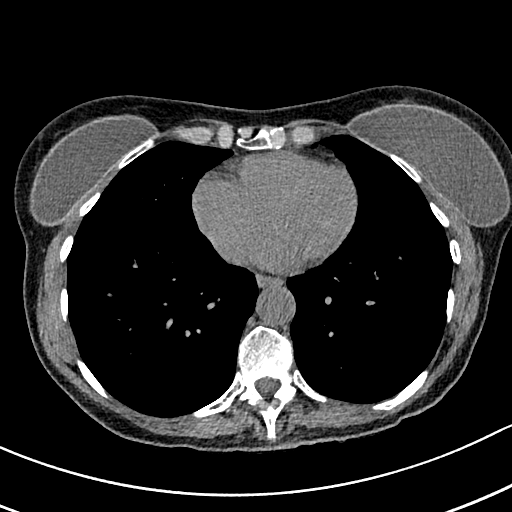
[im 60/156  lung]
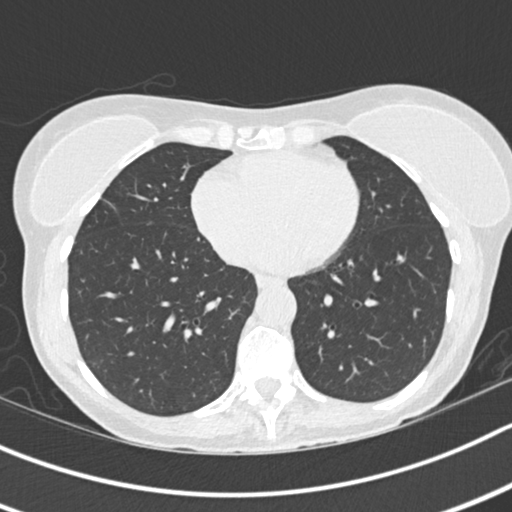
[im 72/156  lung]
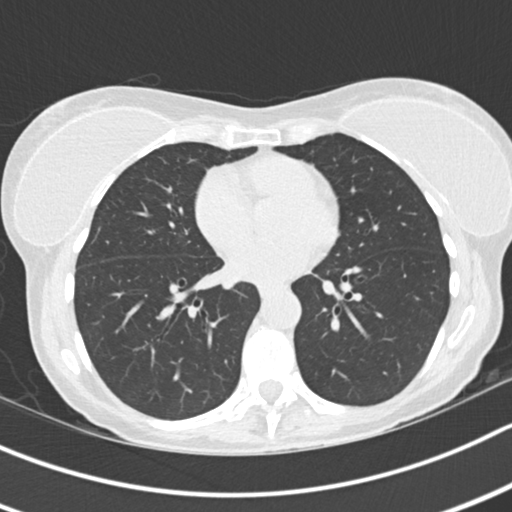
[im 84/156  lung]
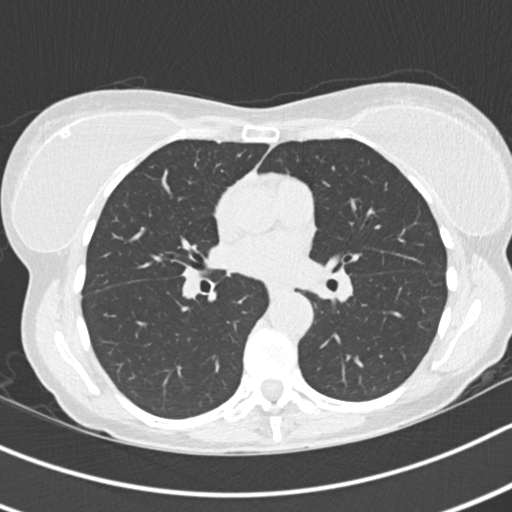
[im 96/156  lung]
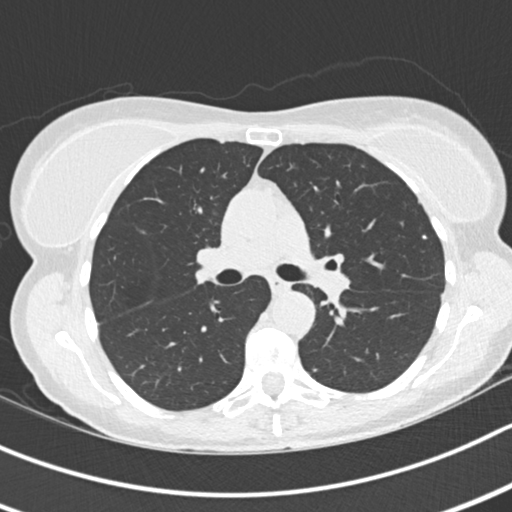
[im 108/156  mediastinal]
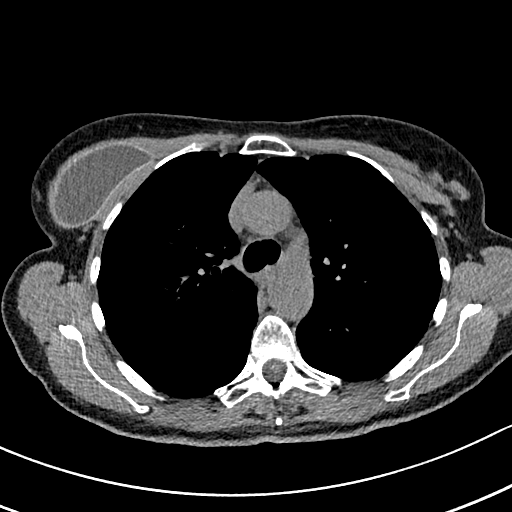
[im 108/156  lung]
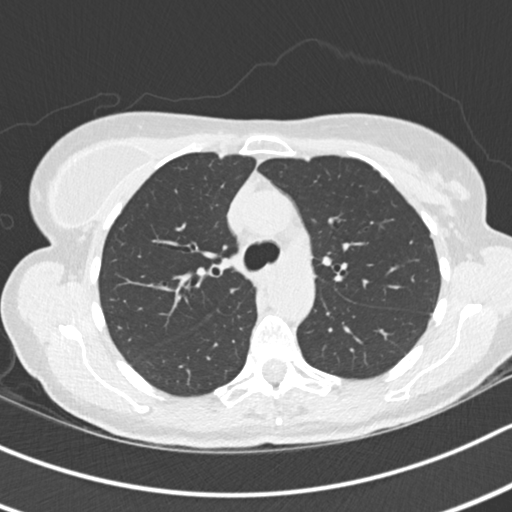
[im 120/156  lung]
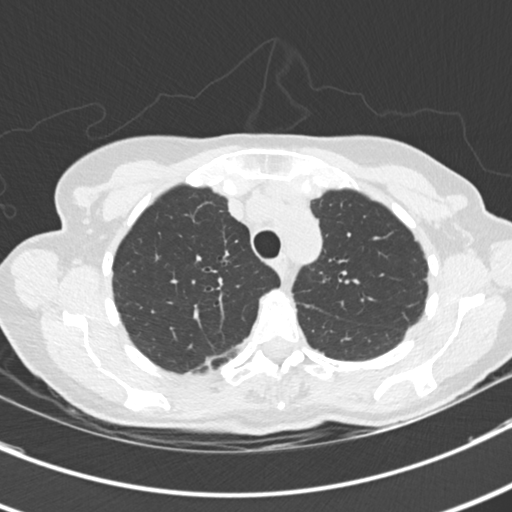
[im 132/156  lung]
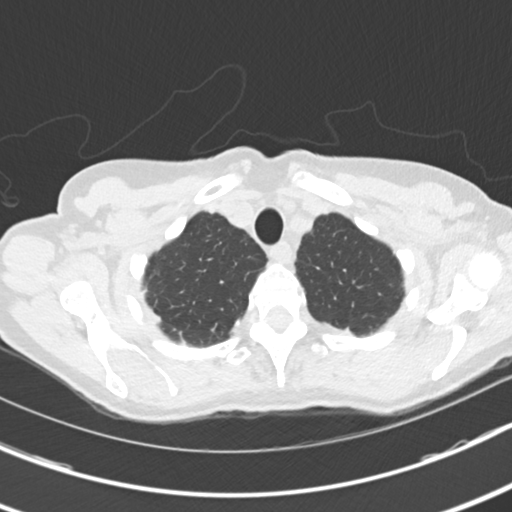
[im 144/156  lung]
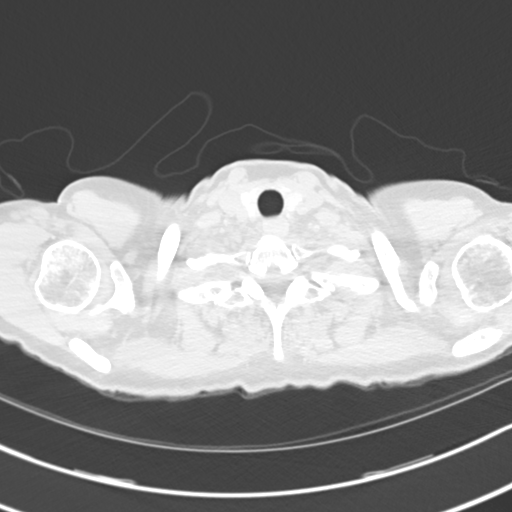

[Series 5: coronal · coronal · 0.54mm/px · 3 of 111 slices shown]
[im 23/111  lung]
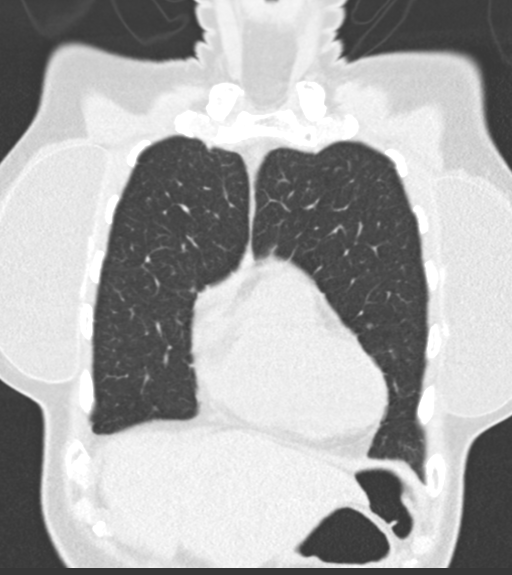
[im 45/111  lung]
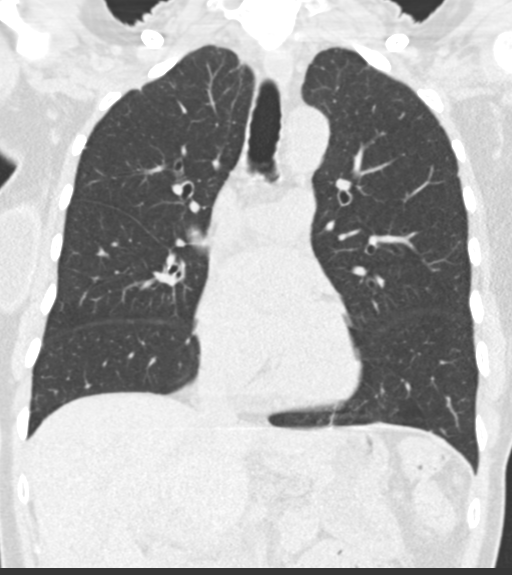
[im 67/111  lung]
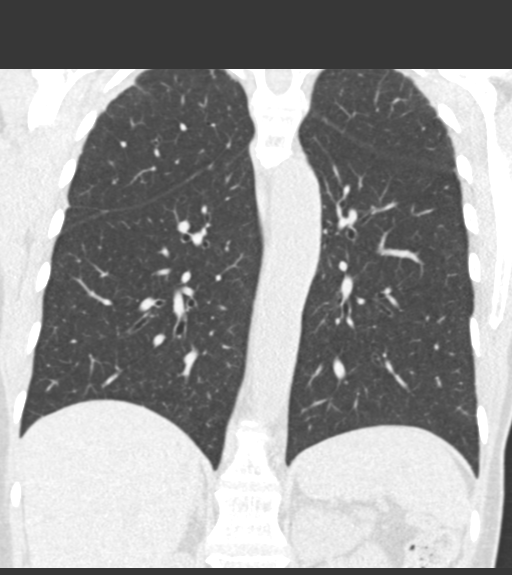

[15 of 36 positions shown; findings below may reference images not displayed]

FINDINGS: Cardiovascular: No significant vascular findings on noncontrast
imaging. The heart size is normal. There is no pericardial effusion.

Mediastinum/Nodes: There are no enlarged mediastinal, hilar or
axillary lymph nodes.Hilar assessment is limited by the lack of
intravenous contrast, although the hilar contours appear unchanged.
The thyroid gland, trachea and esophagus demonstrate no significant
findings.

Lungs/Pleura: No pleural effusion or pneumothorax. Stable mild
biapical scarring and mild central airway thickening. Scattered
small pulmonary nodules are unchanged, largest measuring 6 x 5 mm in
the right lower lobe on image 112/3. Some of the nodules are
calcified (granulomas). No new, enlarging or suspicious pulmonary
nodules.

Upper abdomen: The visualized upper abdomen appears stable without
suspicious findings.

Musculoskeletal/Chest wall: There is no chest wall mass or
suspicious osseous finding. Bilateral breast implants. Mild thoracic
spondylosis.
IMPRESSION: 1. Small pulmonary nodules are unchanged from 06/25/2019, consistent
with a benign etiology. Per consensus guidelines, no additional
imaging follow-up recommended. This recommendation follows the
consensus statement: Guidelines for Management of Small Pulmonary
Nodules Detected on CT Images: From the [HOSPITAL] 4179;
2. No acute chest findings.
3. Sequela of prior granulomatous disease.
